# Patient Record
Sex: Female | Born: 1980
Health system: Southern US, Community
[De-identification: ages and names within clinical notes are randomized; demographics above are authoritative.]

## PROBLEM LIST (undated history)

## (undated) ENCOUNTER — Inpatient Hospital Stay (HOSPITAL_COMMUNITY): Payer: Self-pay

## (undated) DIAGNOSIS — Z8619 Personal history of other infectious and parasitic diseases: Secondary | ICD-10-CM

## (undated) DIAGNOSIS — B009 Herpesviral infection, unspecified: Secondary | ICD-10-CM

## (undated) DIAGNOSIS — D696 Thrombocytopenia, unspecified: Secondary | ICD-10-CM

## (undated) DIAGNOSIS — D649 Anemia, unspecified: Secondary | ICD-10-CM

## (undated) DIAGNOSIS — Z862 Personal history of diseases of the blood and blood-forming organs and certain disorders involving the immune mechanism: Secondary | ICD-10-CM

## (undated) DIAGNOSIS — I863 Vulval varices: Secondary | ICD-10-CM

## (undated) HISTORY — DX: Anemia, unspecified: D64.9

## (undated) HISTORY — DX: Thrombocytopenia, unspecified: D69.6

## (undated) HISTORY — DX: Personal history of diseases of the blood and blood-forming organs and certain disorders involving the immune mechanism: Z86.2

## (undated) HISTORY — DX: Herpesviral infection, unspecified: B00.9

## (undated) HISTORY — DX: Personal history of other infectious and parasitic diseases: Z86.19

## (undated) HISTORY — DX: Vulval varices: I86.3

---

## 2008-07-23 ENCOUNTER — Inpatient Hospital Stay (HOSPITAL_COMMUNITY): Admission: AD | Admit: 2008-07-23 | Discharge: 2008-07-23 | Payer: Self-pay | Admitting: Obstetrics & Gynecology

## 2008-07-23 ENCOUNTER — Inpatient Hospital Stay (HOSPITAL_COMMUNITY): Admission: AD | Admit: 2008-07-23 | Discharge: 2008-07-25 | Payer: Self-pay | Admitting: Obstetrics and Gynecology

## 2011-05-20 LAB — RH IMMUNE GLOB WKUP(>/=20WKS)(NOT WOMEN'S HOSP): Fetal Screen: NEGATIVE

## 2011-05-20 LAB — CBC
HCT: 28.9 % — ABNORMAL LOW (ref 36.0–46.0)
HCT: 36.2 % (ref 36.0–46.0)
Hemoglobin: 10.1 g/dL — ABNORMAL LOW (ref 12.0–15.0)
Hemoglobin: 12.7 g/dL (ref 12.0–15.0)
MCHC: 35.1 g/dL (ref 30.0–36.0)
MCHC: 35.3 g/dL (ref 30.0–36.0)
MCV: 96.4 fL (ref 78.0–100.0)
MCV: 96.7 fL (ref 78.0–100.0)
Platelets: 86 10*3/uL — ABNORMAL LOW (ref 150–400)
Platelets: 88 10*3/uL — ABNORMAL LOW (ref 150–400)
Platelets: 94 10*3/uL — ABNORMAL LOW (ref 150–400)
RBC: 3.02 MIL/uL — ABNORMAL LOW (ref 3.87–5.11)
RDW: 13.1 % (ref 11.5–15.5)
RDW: 13.2 % (ref 11.5–15.5)
RDW: 13.7 % (ref 11.5–15.5)
WBC: 7.8 10*3/uL (ref 4.0–10.5)
WBC: 8.9 10*3/uL (ref 4.0–10.5)

## 2011-05-20 LAB — CCBB MATERNAL DONOR DRAW

## 2012-01-26 LAB — OB RESULTS CONSOLE ABO/RH: RH Type: NEGATIVE

## 2012-01-26 LAB — OB RESULTS CONSOLE HEPATITIS B SURFACE ANTIGEN: Hepatitis B Surface Ag: NEGATIVE

## 2012-01-26 LAB — OB RESULTS CONSOLE HIV ANTIBODY (ROUTINE TESTING): HIV: NONREACTIVE

## 2012-01-26 LAB — OB RESULTS CONSOLE RPR: RPR: NONREACTIVE

## 2012-08-14 LAB — OB RESULTS CONSOLE GBS: GBS: NEGATIVE

## 2012-08-15 NOTE — L&D Delivery Note (Signed)
Delivery Note At 5:57 PM a healthy female was delivered via 2nd VBAC, Spontaneous (Presentation: Right Occiput Anterior), 1 loose [place].  APGAR: 9, 10; weight .   Placenta status: Intact, Spontaneous.  Cord: 3 vessels with the following complications: None.  Cord pH: n/a  Anesthesia: Epidural  Episiotomy: None Lacerations: Right vulvar tears x 2  Suture Repair: vicryl rapide 3-0 and 4-0 Est. Blood Loss (mL): 300  Mom to postpartum.  Baby to nursery-stable.  [name],[name] 09/21/2012, 6:33 PM

## 2012-09-10 ENCOUNTER — Telehealth (HOSPITAL_COMMUNITY): Payer: Self-pay | Admitting: *Deleted

## 2012-09-10 ENCOUNTER — Encounter (HOSPITAL_COMMUNITY): Payer: Self-pay | Admitting: *Deleted

## 2012-09-10 NOTE — Telephone Encounter (Signed)
Preadmission screen  

## 2012-09-11 ENCOUNTER — Other Ambulatory Visit: Payer: Self-pay | Admitting: Obstetrics & Gynecology

## 2012-09-15 ENCOUNTER — Inpatient Hospital Stay (HOSPITAL_COMMUNITY)
Admission: AD | Admit: 2012-09-15 | Discharge: 2012-09-16 | DRG: 886 | Disposition: A | Discharge status: Home / Self Care | Payer: BC Managed Care – PPO | Source: Ambulatory Visit | Attending: Obstetrics and Gynecology | Admitting: Obstetrics and Gynecology

## 2012-09-15 ENCOUNTER — Inpatient Hospital Stay (HOSPITAL_COMMUNITY): Payer: BC Managed Care – PPO

## 2012-09-15 ENCOUNTER — Encounter (HOSPITAL_COMMUNITY): Payer: Self-pay

## 2012-09-15 DIAGNOSIS — O34219 Maternal care for unspecified type scar from previous cesarean delivery: Secondary | ICD-10-CM | POA: Diagnosis present

## 2012-09-15 DIAGNOSIS — O368190 Decreased fetal movements, unspecified trimester, not applicable or unspecified: Principal | ICD-10-CM | POA: Diagnosis present

## 2012-09-15 DIAGNOSIS — O99119 Other diseases of the blood and blood-forming organs and certain disorders involving the immune mechanism complicating pregnancy, unspecified trimester: Secondary | ICD-10-CM | POA: Diagnosis present

## 2012-09-15 DIAGNOSIS — IMO0002 Reserved for concepts with insufficient information to code with codable children: Secondary | ICD-10-CM

## 2012-09-15 DIAGNOSIS — D696 Thrombocytopenia, unspecified: Secondary | ICD-10-CM | POA: Diagnosis present

## 2012-09-15 DIAGNOSIS — D689 Coagulation defect, unspecified: Secondary | ICD-10-CM | POA: Diagnosis present

## 2012-09-15 LAB — CBC
HCT: 29.2 % — ABNORMAL LOW (ref 36.0–46.0)
Hemoglobin: 9.4 g/dL — ABNORMAL LOW (ref 12.0–15.0)
MCH: 28.1 pg (ref 26.0–34.0)
MCHC: 32.2 g/dL (ref 30.0–36.0)
MCV: 87.2 fL (ref 78.0–100.0)
Platelets: 132 10*3/uL — ABNORMAL LOW (ref 150–400)
RBC: 3.35 MIL/uL — ABNORMAL LOW (ref 3.87–5.11)
RDW: 15.9 % — ABNORMAL HIGH (ref 11.5–15.5)
WBC: 9 10*3/uL (ref 4.0–10.5)

## 2012-09-15 MED ORDER — ACETAMINOPHEN 325 MG PO TABS: 650.0000 mg | ORAL_TABLET | ORAL | Status: DC | PRN

## 2012-09-15 MED ORDER — CALCIUM CARBONATE ANTACID 500 MG PO CHEW
2.0000 | CHEWABLE_TABLET | ORAL | Status: DC | PRN
  Filled 2012-09-15: qty 2

## 2012-09-15 NOTE — MAU Provider Note (Signed)
  History     CSN: [id number]  Arrival date and time: 09/15/12 4098 Nurse report received - verbal order for NST and BPP given @ 2007 (provider en route for delivery) Provider here to evaluate patient @ 2115   Chief Complaint  Patient presents with  . Decreased Fetal Movement   HPI  Decreased fetal movement since this am - last movement when she woke up this am Mild occasional ctx No bleeding or LOF  Past Medical History  Diagnosis Date  . Personal history of diseases of blood and blood-forming organs     thrombophilia  . Hx of varicella   . Vulval varices   . Herpes simplex without mention of complication   . Thrombocytopenia, unspecified   . Anemia    Past Surgical History  Procedure Date  . Cesarean section    Family History  Problem Relation Age of Onset  . Diabetes Father   . Migraines Sister   . Cancer Maternal Grandmother     pancreatic   History  Substance Use Topics  . Smoking status: Never Smoker   . Smokeless tobacco: Not on file  . Alcohol Use: No   Allergies: No Known Allergies  Prescriptions prior to admission  Medication Sig Dispense Refill  . acetaminophen (TYLENOL) 500 MG tablet Take 1,000 mg by mouth every 6 (six) hours as needed.      . Prenatal Vit-Fe Fumarate-FA (PRENATAL MULTIVITAMIN) TABS Take 1 tablet by mouth daily.      . valACYclovir (VALTREX) 500 MG tablet Take 500 mg by mouth daily.       ROS Physical Exam   Blood pressure 105/59, pulse 62, temperature 98.7 F (37.1 C), temperature source Oral, resp. rate 18, height 5' 2.25" (1.581 m), weight 67.405 kg (148 lb 9.6 oz), last menstrual period 12/06/2011.  Physical Exam  EFM initial tracing 95-115 baseline with irregular variability - maternal pulse documented with pulse oximetry 70-80 range. Off EFM to go to ultrasound. Upon return to MAU EFM reapplied with marked variability 110-130 with gradual increase of FHR to 170-180 range with decreased variability. EFM 130-150 increased  variability - no decelerations.  MAU Course  Procedures  NST - equivocal due to unstable baseline BPP - 8/8 with normal AFI  Assessment and Plan   40 4/7 with decreased FM Abnormal FHR baseline - normal BPP and AFI  1) admit 2) prolonged EFM 3) MD re-evaluate in AM   Consult with Dr Billy Coast with plan for extended [facility] - no indication for delivery based on EFM - desired VBAC.  Will monitor next 6-8 hours then make further decision after review of extended EFM tracing.  Discussed with patient and spouse - recommended extended monitoring to ensure fetal well-being prior to discharge to home. No indication for induction at this time. Will re-evaluate status for induction / delivery with any concern in FHR tracing. Agrees with plan of care  - prolonged EFM til AM. Marlinda Mike 09/15/2012, 9:23 PM

## 2012-09-15 NOTE — Plan of Care (Signed)
Problem: Consults Goal: Birthing Suites Patient Information Press F2 to bring up selections list Outcome: Completed/Met Date Met:  09/15/12  Pt > [redacted] weeks EGA  Comments:  40.4 wks

## 2012-09-15 NOTE — H&P (Signed)
  OB ADMISSION/ HISTORY & PHYSICAL:  Admission Date: 09/15/2012  7:42 PM  Admit Diagnosis: 40 4/7 weeks with decreased FM / abnormal FHR baseline  Mary Hood is a 32 y.o. female presenting for decreased FM - no FM since waking this am.  Prenatal History: [id number]   EDC : 09/11/2012, by Last Menstrual Period  Prenatal care at [facility]  Primary Ob Provider:Dr Lavoie Prenatal course complicated by genetic thrombophilia/ HSV (on suppression) / previous CS and successful VBAC with plans to VBAC again this pregnancy / anemia / mild ITP (stable PLT)  Prenatal Labs: ABO, Rh: A (06/13 0000) negative Antibody: Negative (06/13 0000) Rubella: Immune (06/13 0000)  RPR: Nonreactive (06/13 0000)  HBsAg: Negative (06/13 0000)  HIV: Non-reactive (06/13 0000)  GBS: Negative (12/31 0000)  1 hr Glucola : NL  Medical / Surgical History :  Past medical history:  Past Medical History  Diagnosis Date  . Personal history of diseases of blood and blood-forming organs     thrombophilia  . Hx of varicella   . Vulval varices   . Herpes simplex without mention of complication   . Thrombocytopenia, unspecified   . Anemia     Past surgical history:  Past Surgical History  Procedure Date  . Cesarean section    Family History:  Family History  Problem Relation Age of Onset  . Diabetes Father   . Migraines Sister   . Cancer Maternal Grandmother     pancreatic    Social History:  reports that she has never smoked. She does not have any smokeless tobacco history on file. She reports that she does not drink alcohol or use illicit drugs.  Allergies: Review of patient's allergies indicates no known allergies.   Current Medications at time of admission:  No current facility-administered medications for this encounter.   Review of Systems: No FM all day No ctx or labor signs  Physical Exam:  VS: Blood pressure 105/59, pulse 62, temperature 98.7 F (37.1 C), temperature  source Oral, resp. rate 18, height 5' 2.25" (1.581 m), weight 67.405 kg (148 lb 9.6 oz), last menstrual period 12/06/2011.  General: alert and oriented, appears NAD or discomfort Heart: RRR Lungs: Clear lung fields Abdomen: Gravid, soft and non-tender, non-distended / uterus: nontender Extremities: no edema  Genitalia / VE:  deferred at time of admit / previous exam 3cm dilated / 70 % / vtx  FHR: baseline rate unstable - initially 95-115 x 10 minutes then 170-180 with decreased variability for 15 minutes then 130-140 range with increased variability  TOCO: rare ctx - no decels with ctx  Assessment: Postdates with decreased FM Unstable FHR baseline with normal BPP and AFI Previous CS and VBAC with strong desire to repeat VBAC - not in labor  Plan:  Admit - extended EFM to evaluate fetal status CBC Re-evaluate in AM or sooner if any fetal concerns with monitoring  Dr Billy Coast notified of admission / plan of care - agrees with prolonged EFM No indication for IOL at this time since planning VBAC   Marlinda Mike CNM, MSN 09/15/2012, 9:43 PM

## 2012-09-15 NOTE — MAU Note (Signed)
Last felt baby move this morning. Denies contractions or leaking of fluid.

## 2012-09-16 ENCOUNTER — Encounter (HOSPITAL_COMMUNITY): Payer: Self-pay | Admitting: Certified Nurse Midwife

## 2012-09-16 MED ORDER — IRON POLYSACCH CMPLX-B12-FA 150-0.025-1 MG PO CAPS: 150.0000 mg | ORAL_CAPSULE | Freq: Two times a day (BID) | ORAL | Status: DC

## 2012-09-16 NOTE — Discharge Summary (Signed)
Patient ID: Mary Hood MRN: [id number] DOB/AGE: 10/06/1980 32 y.o.  Admit date: 09/15/2012 Admission Diagnoses: decreased FM  / abnormal FHR baseline    Discharge date:  09/16/2012 Discharge Diagnoses:   40 5/7 weeks / category 1 tracing - NST reactive / improved fetal movements  Prenatal history: G3P2002   EDC : 09/11/2012, by Last Menstrual Period  Prenatal care at [facility]  Primary provider : Seymour Bars Prenatal course complicated by previous CS with successful VBAC (plans VBAC) / IDA / gestational thrombocytopenia / genetic thrombophilia (?type)  Prenatal Labs: ABO, Rh: A (06/13 0000) negative Antibody: POS (02/01 2200) Rubella: Immune (06/13 0000)  RPR: Nonreactive (06/13 0000)  HBsAg: Negative (06/13 0000)  HIV: Non-reactive (06/13 0000)  GBS: Negative (12/31 0000)  1 hr Glucola : NL  Medical / Surgical History :  Past medical history:  Past Medical History  Diagnosis Date  . Personal history of diseases of blood and blood-forming organs     thrombophilia  . Hx of varicella   . Vulval varices   . Herpes simplex without mention of complication   . Thrombocytopenia, unspecified   . Anemia     Past surgical history:  Past Surgical History  Procedure Date  . Cesarean section     Family History:  Family History  Problem Relation Age of Onset  . Diabetes Father   . Migraines Sister   . Cancer Maternal Grandmother     pancreatic    Social History:  reports that she has never smoked. She does not have any smokeless tobacco history on file. She reports that she does not drink alcohol or use illicit drugs.  Allergies: Review of patient's allergies indicates no known allergies.   Current Medications at time of admission:  Prior to Admission medications   Medication Sig Start Date End Date Taking? Authorizing Provider  acetaminophen (TYLENOL) 500 MG tablet Take 1,000 mg by mouth every 6 (six) hours as needed.   Yes Historical Provider, MD   Prenatal Vit-Fe Fumarate-FA (PRENATAL MULTIVITAMIN) TABS Take 1 tablet by mouth daily.   Yes Historical Provider, MD  valACYclovir (VALTREX) 500 MG tablet Take 500 mg by mouth daily.   Yes Historical Provider, MD    Hospital Course: Evaluated inMAU for decreased FM - NST equivocal with unstable FHR baseline (initially 95-115 - documented maternal pulse 70-80  / episode of tachycardia 170-180 / baseline eventually settled into 130-140 range with normal variability and reactive tracing / no further changes in FHR baseline in 8 hours /BPP 8-8 with normal AFI.  CBC checked due to patient hx IDA and thrombocytopenia - HGB 9.4 with PLT 132.  Physical Exam:   VSS: Temp:  [98.1 F (36.7 C)-98.9 F (37.2 C)] 98.1 F (36.7 C) (02/02 0755) Pulse Rate:  [60-62] 60  (02/02 0755) Resp:  [18-20] 20  (02/02 0755) BP: (98-105)/(59-62) 98/60 mmHg (02/02 0755) Weight:  [67.405 kg (148 lb 9.6 oz)] 67.405 kg (148 lb 9.6 oz) (02/01 1953)  LABS:  hemogloblin 9.4 and platelet 132 General: pleasant / NAD Heart:RRR Lungs:clear and unlabored Abdomen: gravid and non-tender Extremities: no edema  Fetal assessment - category 1 tracing with stable baseline 130-140 for past 8 hours                                  Reactive NST  Discharge Instructions:  Discharged Condition: stable Activity: unrestricted Diet: routine Medications: see below  Medication List     As of 09/16/2012  9:11 AM    TAKE these medications         acetaminophen 500 MG tablet   Commonly known as: TYLENOL   Take 1,000 mg by mouth every 6 (six) hours as needed.      prenatal multivitamin Tabs   Take 1 tablet by mouth daily.      valACYclovir 500 MG tablet   Commonly known as: VALTREX   Take 500 mg by mouth daily.      Added iron daily to continue thru postpartum  Discharge Instructions:   Discharge to: Home Follow up :      Follow-up Information    Follow up with LAVOIE,[name], MD. In 2 days.   Contact  information:   [address] [place] [address] [phone]         Signed: Marlinda Mike, CNM, MSN 09/16/2012, 9:11 AM

## 2012-09-16 NOTE — Progress Notes (Signed)
S:  Feeling FM throughout the night      No labor signs  O:  VS: Blood pressure 98/60, pulse 60, temperature 98.1 F (36.7 C), temperature source Oral, resp. rate 20, height 5' 2.25" (1.581 m), weight 67.405 kg (148 lb 9.6 oz), last menstrual period 12/06/2011.        FHR : baseline 130-140 range / variability moderate / accels + / decels none        Toco: contractions rare and mild        Cervix : deferred     A: Decreased FM yesterday with equivocal EFM tracing     Reactive NST this am / active FM today     FHR category 1  P: discharge to home this am     Await onset of labor     Apt at [facility] Tuesday as scheduled - daily FKC   Marlinda Mike CNM, MSN 09/16/2012, 9:06 AM

## 2012-09-18 LAB — TYPE AND SCREEN
ABO/RH(D): A NEG
Antibody Screen: POSITIVE
DAT, IgG: NEGATIVE
Unit division: 0

## 2012-09-21 ENCOUNTER — Encounter (HOSPITAL_COMMUNITY): Payer: Self-pay | Admitting: Anesthesiology

## 2012-09-21 ENCOUNTER — Inpatient Hospital Stay (HOSPITAL_COMMUNITY): Payer: BC Managed Care – PPO | Admitting: Anesthesiology

## 2012-09-21 ENCOUNTER — Inpatient Hospital Stay (HOSPITAL_COMMUNITY)
Admission: RE | Admit: 2012-09-21 | Discharge: 2012-09-22 | DRG: 373 | Disposition: A | Discharge status: Home / Self Care | Payer: BC Managed Care – PPO | Source: Ambulatory Visit | Attending: Obstetrics & Gynecology | Admitting: Obstetrics & Gynecology

## 2012-09-21 ENCOUNTER — Encounter (HOSPITAL_COMMUNITY): Payer: Self-pay

## 2012-09-21 VITALS — BP 109/71 | HR 67 | Temp 97.9°F | Resp 18 | Ht 62.25 in | Wt 148.0 lb

## 2012-09-21 DIAGNOSIS — O480 Post-term pregnancy: Principal | ICD-10-CM | POA: Diagnosis present

## 2012-09-21 DIAGNOSIS — O328XX0 Maternal care for other malpresentation of fetus, not applicable or unspecified: Secondary | ICD-10-CM | POA: Diagnosis present

## 2012-09-21 DIAGNOSIS — D649 Anemia, unspecified: Secondary | ICD-10-CM | POA: Diagnosis not present

## 2012-09-21 DIAGNOSIS — O368190 Decreased fetal movements, unspecified trimester, not applicable or unspecified: Secondary | ICD-10-CM

## 2012-09-21 DIAGNOSIS — O9903 Anemia complicating the puerperium: Secondary | ICD-10-CM | POA: Diagnosis not present

## 2012-09-21 DIAGNOSIS — O9902 Anemia complicating childbirth: Secondary | ICD-10-CM | POA: Diagnosis present

## 2012-09-21 LAB — CBC
HCT: 31.4 % — ABNORMAL LOW (ref 36.0–46.0)
HCT: 32.7 % — ABNORMAL LOW (ref 36.0–46.0)
Hemoglobin: 10.3 g/dL — ABNORMAL LOW (ref 12.0–15.0)
Hemoglobin: 10.6 g/dL — ABNORMAL LOW (ref 12.0–15.0)
MCH: 28.4 pg (ref 26.0–34.0)
MCH: 28 pg (ref 26.0–34.0)
MCHC: 32.8 g/dL (ref 30.0–36.0)
MCHC: 32.4 g/dL (ref 30.0–36.0)
MCV: 86.5 fL (ref 78.0–100.0)
Platelets: 135 10*3/uL — ABNORMAL LOW (ref 150–400)
RBC: 3.63 MIL/uL — ABNORMAL LOW (ref 3.87–5.11)
RDW: 16.1 % — ABNORMAL HIGH (ref 11.5–15.5)
WBC: 7.3 10*3/uL (ref 4.0–10.5)

## 2012-09-21 LAB — PREPARE RBC (CROSSMATCH)

## 2012-09-21 MED ORDER — EPHEDRINE 5 MG/ML INJ
10.0000 mg | INTRAVENOUS | Status: DC | PRN
  Filled 2012-09-21: qty 4

## 2012-09-21 MED ORDER — IBUPROFEN 600 MG PO TABS: 600.0000 mg | ORAL_TABLET | Freq: Four times a day (QID) | ORAL | Status: DC | PRN

## 2012-09-21 MED ORDER — ZOLPIDEM TARTRATE 5 MG PO TABS: 5.0000 mg | ORAL_TABLET | Freq: Every evening | ORAL | Status: DC | PRN

## 2012-09-21 MED ORDER — OXYCODONE-ACETAMINOPHEN 5-325 MG PO TABS: 1.0000 | ORAL_TABLET | ORAL | Status: DC | PRN

## 2012-09-21 MED ORDER — OXYTOCIN 40 UNITS IN LACTATED RINGERS INFUSION - SIMPLE MED
1.0000 m[IU]/min | INTRAVENOUS | Status: DC
  Administered 2012-09-21: 2 m[IU]/min via INTRAVENOUS
  Administered 2012-09-21: 6 m[IU]/min via INTRAVENOUS
  Administered 2012-09-21: 8 m[IU]/min via INTRAVENOUS
  Filled 2012-09-21: qty 1000

## 2012-09-21 MED ORDER — LANOLIN HYDROUS EX OINT: TOPICAL_OINTMENT | CUTANEOUS | Status: DC | PRN

## 2012-09-21 MED ORDER — DIBUCAINE 1 % RE OINT: 1.0000 "application " | TOPICAL_OINTMENT | RECTAL | Status: DC | PRN

## 2012-09-21 MED ORDER — ONDANSETRON HCL 4 MG/2ML IJ SOLN: 4.0000 mg | INTRAMUSCULAR | Status: DC | PRN

## 2012-09-21 MED ORDER — LACTATED RINGERS IV SOLN
500.0000 mL | INTRAVENOUS | Status: DC | PRN
  Administered 2012-09-21: 1000 mL via INTRAVENOUS

## 2012-09-21 MED ORDER — OXYTOCIN BOLUS FROM INFUSION: 500.0000 mL | INTRAVENOUS | Status: DC

## 2012-09-21 MED ORDER — FLEET ENEMA 7-19 GM/118ML RE ENEM: 1.0000 | ENEMA | RECTAL | Status: DC | PRN

## 2012-09-21 MED ORDER — ONDANSETRON HCL 4 MG/2ML IJ SOLN: 4.0000 mg | Freq: Four times a day (QID) | INTRAMUSCULAR | Status: DC | PRN

## 2012-09-21 MED ORDER — FENTANYL 2.5 MCG/ML BUPIVACAINE 1/10 % EPIDURAL INFUSION (WH - ANES)
14.0000 mL/h | INTRAMUSCULAR | Status: DC
  Filled 2012-09-21: qty 125

## 2012-09-21 MED ORDER — SIMETHICONE 80 MG PO CHEW: 80.0000 mg | CHEWABLE_TABLET | ORAL | Status: DC | PRN

## 2012-09-21 MED ORDER — TETANUS-DIPHTH-ACELL PERTUSSIS 5-2.5-18.5 LF-MCG/0.5 IM SUSP: 0.5000 mL | Freq: Once | INTRAMUSCULAR | Status: DC

## 2012-09-21 MED ORDER — LIDOCAINE HCL (PF) 1 % IJ SOLN
INTRAMUSCULAR | Status: DC | PRN
  Administered 2012-09-21: 4 mL
  Administered 2012-09-21: 30 mL
  Administered 2012-09-21: 4 mL

## 2012-09-21 MED ORDER — WITCH HAZEL-GLYCERIN EX PADS: 1.0000 "application " | MEDICATED_PAD | CUTANEOUS | Status: DC | PRN

## 2012-09-21 MED ORDER — FENTANYL 2.5 MCG/ML BUPIVACAINE 1/10 % EPIDURAL INFUSION (WH - ANES)
INTRAMUSCULAR | Status: DC | PRN
  Administered 2012-09-21: 13 mL/h via EPIDURAL

## 2012-09-21 MED ORDER — OXYTOCIN 40 UNITS IN LACTATED RINGERS INFUSION - SIMPLE MED: 62.5000 mL/h | INTRAVENOUS | Status: DC

## 2012-09-21 MED ORDER — LIDOCAINE HCL (PF) 1 % IJ SOLN
30.0000 mL | INTRAMUSCULAR | Status: DC | PRN
  Filled 2012-09-21: qty 30

## 2012-09-21 MED ORDER — ONDANSETRON HCL 4 MG PO TABS: 4.0000 mg | ORAL_TABLET | ORAL | Status: DC | PRN

## 2012-09-21 MED ORDER — DIPHENHYDRAMINE HCL 25 MG PO CAPS: 25.0000 mg | ORAL_CAPSULE | Freq: Four times a day (QID) | ORAL | Status: DC | PRN

## 2012-09-21 MED ORDER — ACETAMINOPHEN 325 MG PO TABS: 650.0000 mg | ORAL_TABLET | ORAL | Status: DC | PRN

## 2012-09-21 MED ORDER — BENZOCAINE-MENTHOL 20-0.5 % EX AERO
1.0000 "application " | INHALATION_SPRAY | CUTANEOUS | Status: DC | PRN
  Administered 2012-09-22: 1 via TOPICAL
  Filled 2012-09-21 (×2): qty 56

## 2012-09-21 MED ORDER — PHENYLEPHRINE 40 MCG/ML (10ML) SYRINGE FOR IV PUSH (FOR BLOOD PRESSURE SUPPORT)
80.0000 ug | PREFILLED_SYRINGE | INTRAVENOUS | Status: DC | PRN
  Filled 2012-09-21: qty 5

## 2012-09-21 MED ORDER — CITRIC ACID-SODIUM CITRATE 334-500 MG/5ML PO SOLN: 30.0000 mL | ORAL | Status: DC | PRN

## 2012-09-21 MED ORDER — PRENATAL MULTIVITAMIN CH
1.0000 | ORAL_TABLET | Freq: Every day | ORAL | Status: DC
  Administered 2012-09-22: 1 via ORAL
  Filled 2012-09-21: qty 1

## 2012-09-21 MED ORDER — LACTATED RINGERS IV SOLN
INTRAVENOUS | Status: DC
  Administered 2012-09-21 (×2): via INTRAVENOUS

## 2012-09-21 MED ORDER — TERBUTALINE SULFATE 1 MG/ML IJ SOLN: 0.2500 mg | Freq: Once | INTRAMUSCULAR | Status: DC | PRN

## 2012-09-21 MED ORDER — IBUPROFEN 600 MG PO TABS
600.0000 mg | ORAL_TABLET | Freq: Four times a day (QID) | ORAL | Status: DC
Start: 2012-09-21 — End: 2012-09-22
  Administered 2012-09-21 – 2012-09-22 (×3): 600 mg via ORAL
  Filled 2012-09-21 (×3): qty 1

## 2012-09-21 MED ORDER — PHENYLEPHRINE 40 MCG/ML (10ML) SYRINGE FOR IV PUSH (FOR BLOOD PRESSURE SUPPORT): 80.0000 ug | PREFILLED_SYRINGE | INTRAVENOUS | Status: DC | PRN

## 2012-09-21 MED ORDER — SENNOSIDES-DOCUSATE SODIUM 8.6-50 MG PO TABS
2.0000 | ORAL_TABLET | Freq: Every day | ORAL | Status: DC
Start: 2012-09-21 — End: 2012-09-22
  Administered 2012-09-21: 2 via ORAL

## 2012-09-21 MED ORDER — LACTATED RINGERS IV SOLN: 500.0000 mL | Freq: Once | INTRAVENOUS | Status: DC

## 2012-09-21 MED ORDER — EPHEDRINE 5 MG/ML INJ: 10.0000 mg | INTRAVENOUS | Status: DC | PRN

## 2012-09-21 MED ORDER — DIPHENHYDRAMINE HCL 50 MG/ML IJ SOLN: 12.5000 mg | INTRAMUSCULAR | Status: DC | PRN

## 2012-09-21 NOTE — Anesthesia Preprocedure Evaluation (Signed)
Anesthesia Evaluation  Patient identified by MRN, date of birth, ID band Patient awake    Reviewed: Allergy & Precautions, H&P , Patient's Chart, lab work & pertinent test results  Airway Mallampati: III TM Distance: >3 FB Neck ROM: full    Dental No notable dental hx. (+) Teeth Intact   Pulmonary neg pulmonary ROS,  breath sounds clear to auscultation  Pulmonary exam normal       Cardiovascular negative cardio ROS  Rhythm:regular Rate:Normal     Neuro/Psych negative neurological ROS  negative psych ROS   GI/Hepatic negative GI ROS, Neg liver ROS,   Endo/Other  negative endocrine ROS  Renal/GU negative Renal ROS  negative genitourinary   Musculoskeletal   Abdominal Normal abdominal exam  (+)   Peds  Hematology negative hematology ROS (+) anemia , Thrombocytopenica   Anesthesia Other Findings   Reproductive/Obstetrics (+) Pregnancy                           Anesthesia Physical Anesthesia Plan  ASA: II  Anesthesia Plan: Epidural   Post-op Pain Management:    Induction:   Airway Management Planned:   Additional Equipment:   Intra-op Plan:   Post-operative Plan:   Informed Consent: I have reviewed the patients History and Physical, chart, labs and discussed the procedure including the risks, benefits and alternatives for the proposed anesthesia with the patient or authorized representative who has indicated his/her understanding and acceptance.     Plan Discussed with: Anesthesiologist  Anesthesia Plan Comments:         Anesthesia Quick Evaluation

## 2012-09-21 NOTE — Progress Notes (Signed)
Called Anesthesia to notify of platelets being 116, MD ok to remove epidural catheter.

## 2012-09-21 NOTE — H&P (Signed)
Subjective:  Mary Hood is a 33 y.o. G3 P2 female with EDC 09/11/2012 at 41 and 3/[redacted] weeks gestation who is being admitted for induction of labor.  Her current obstetrical history is significant for postdates.  Patient reports no complaints.   Fetal Movement: normal.     Objective:   Vital signs in last 24 hours: Temp:  [98.2 F (36.8 C)] 98.2 F (36.8 C) (02/07 0708) Pulse Rate:  [46-69] 62  (02/07 1249) Resp:  [20] 20  (02/07 0708) BP: (94-112)/(48-77) 109/71 mmHg (02/07 1247) SpO2:  [78 %-100 %] 100 % (02/07 1249) Weight:  [67.132 kg (148 lb)] 67.132 kg (148 lb) (02/07 0921)   General:   alert  Skin:   normal  HEENT:  PERRLA  Lungs:   clear to auscultation bilaterally  Heart:   regular rate and rhythm, S1, S2 normal, no murmur, click, rub or gallop  Breasts:   normal without suspicious masses, skin or nipple changes or axillary nodes and self-exam is taught and encouraged  Abdomen:  normal findings: Gravid  Pelvis:  Cervix: 2/70%/VTx/-2 compound presentation with fingers felt.  AROM very little AF.  FHT:  140's BPM  Accelerations present, no deceleration.  Uterine Size: size equals dates  Presentations: cephalic  Cervix:    Dilation: 2cm   Effacement: 70   Station:  -2   Consistency: soft   Position: middle   Lab Review  A, Rh -  Rhophylac received.  AFP:NML, Ultrascreen neg.  [place] anato wnl.  One hour GTT: Normal   GBS neg.  Assessment/Plan:  41 and 3/[redacted] weeks gestation. Not in labor. Induction with AROM/Pito.  Compound presentation cephalic/hand, will observe.   F-well-being reassuring.  PAI-1, BB ASA d/ced.   Obstetrical history significant for post dates.     Risks, benefits, alternatives and possible complications have been discussed in detail with the patient.  Pre-admission, admission, and post admission procedures and expectations were discussed in detail.  All questions answered, all appropriate consents will be signed at the Hospital. Admission is planned for  today.  Admit for Induction.

## 2012-09-21 NOTE — Anesthesia Procedure Notes (Signed)
Epidural Patient location during procedure: OB Start time: 09/21/2012 12:46 PM  Staffing Anesthesiologist: Ege Muckey A. Performed by: anesthesiologist   Preanesthetic Checklist Completed: patient identified, site marked, surgical consent, pre-op evaluation, timeout performed, IV checked, risks and benefits discussed and monitors and equipment checked  Epidural Patient position: sitting Prep: site prepped and draped and DuraPrep Patient monitoring: continuous pulse ox and blood pressure Approach: midline Injection technique: LOR air  Needle:  Needle type: Tuohy  Needle gauge: 17 G Needle length: 9 cm and 9 Needle insertion depth: 5 cm cm Catheter type: closed end flexible Catheter size: 19 Gauge Catheter at skin depth: 10 cm Test dose: negative and Other  Assessment Events: blood not aspirated, injection not painful, no injection resistance, negative IV test and no paresthesia  Additional Notes Patient identified. Risks and benefits discussed including failed block, incomplete  Pain control, post dural puncture headache, nerve damage, paralysis, blood pressure Changes, nausea, vomiting, reactions to medications-both toxic and allergic and post Partum back pain. All questions were answered. Patient expressed understanding and wished to proceed. Sterile technique was used throughout procedure. Epidural site was Dressed with sterile barrier dressing. No paresthesias, signs of intravascular injection Or signs of intrathecal spread were encountered.  Patient was more comfortable after the epidural was dosed. Please see RN's note for documentation of vital signs and FHR which are stable.

## 2012-09-21 NOTE — Progress Notes (Signed)
efm d/cd per anesthesia  

## 2012-09-21 NOTE — Progress Notes (Signed)
Subjective: Doing well, pain controled, UCs q3 min,  Anesthesia epidural   Objective: BP 105/58  Pulse 45  Temp 98.2 F (36.8 C) (Oral)  Resp 20  Ht 5' 2.25" (1.581 m)  Wt 67.132 kg (148 lb)  BMI 26.85 kg/m2  SpO2 97%  LMP 12/06/2011   FHT:  FHR: 140's bpm, variability: moderate,  accelerations:  Present,  decelerations:  Present Occasional mild variable decelerations UC:   regular, every 3 minutes VE:   8 cm/100%/Vtx ROA/+2   Assessment / Plan: Induction of labor due to postterm,  progressing well on pitocin.  Fetal Wellbeing:  Category I Pain Control:  Epidural  Anticipated MOD:  NSVD/Preparing for delivery.  Mary Hood,Mary Hood 09/21/2012, 4:00 PM

## 2012-09-21 NOTE — Progress Notes (Signed)
Subjective:  2nd TOLAC Doing well, pain controled with Epidural, UCs q3 min ++  Anesthesia Epidural   Objective: BP 109/71  Pulse 62  Temp 98.2 F (36.8 C) (Oral)  Resp 20  Ht 5' 2.25" (1.581 m)  Wt 67.132 kg (148 lb)  BMI 26.85 kg/m2  SpO2 100%  LMP 12/06/2011   FHT:  FHR: 140's bpm, variability: moderate,  accelerations:  Present,  decelerations:  Absent UC:   regular, every 3 minutes VE:   Dilation: 2 Effacement (%): 70 Station: -1 Exam by:: Paul Dykes RNC   Assessment / Plan: Induction of labor due to postterm,  progressing well on pitocin  Fetal Wellbeing:  Category I Pain Control:  Epidural  Anticipated MOD:  NSVD  Glorya Bartley,[name] 09/21/2012, 1:19 PM

## 2012-09-22 DIAGNOSIS — O9902 Anemia complicating childbirth: Secondary | ICD-10-CM | POA: Diagnosis present

## 2012-09-22 LAB — CBC
MCH: 27.8 pg (ref 26.0–34.0)
MCV: 86.9 fL (ref 78.0–100.0)
Platelets: 140 10*3/uL — ABNORMAL LOW (ref 150–400)
RDW: 16.1 % — ABNORMAL HIGH (ref 11.5–15.5)

## 2012-09-22 MED ORDER — RHO D IMMUNE GLOBULIN 1500 UNIT/2ML IJ SOLN
300.0000 ug | Freq: Once | INTRAMUSCULAR | Status: AC
  Administered 2012-09-22: 300 ug via INTRAMUSCULAR
  Filled 2012-09-22: qty 2

## 2012-09-22 MED ORDER — OXYCODONE-ACETAMINOPHEN 5-325 MG PO TABS: 1.0000 | ORAL_TABLET | ORAL | Status: DC | PRN

## 2012-09-22 MED ORDER — IBUPROFEN 600 MG PO TABS: 600.0000 mg | ORAL_TABLET | Freq: Four times a day (QID) | ORAL | Status: DC | PRN

## 2012-09-22 MED ORDER — SENNOSIDES-DOCUSATE SODIUM 8.6-50 MG PO TABS: 2.0000 | ORAL_TABLET | Freq: Every day | ORAL | Status: DC

## 2012-09-22 MED ORDER — POLYSACCHARIDE IRON COMPLEX 150 MG PO CAPS: 150.0000 mg | ORAL_CAPSULE | Freq: Every day | ORAL | Status: DC

## 2012-09-22 MED ORDER — POLYSACCHARIDE IRON COMPLEX 150 MG PO CAPS
150.0000 mg | ORAL_CAPSULE | Freq: Every day | ORAL | Status: DC
  Administered 2012-09-22: 150 mg via ORAL
  Filled 2012-09-22 (×2): qty 1

## 2012-09-22 NOTE — Progress Notes (Signed)
Post Partum Day 2 NSVD without complications viable female infant Subjective: no complaints, up ad lib without syncope, voiding, tolerating PO, + flatus, +BM  Pain well controlled with po meds, taking motrin and percocet  BF: on demand Mood stable, bonding well  Objective: Blood pressure 90/52, pulse 73, temperature 97.6 F (36.4 C), temperature source Oral, resp. rate 18, height 5' 2.25" (1.581 m), weight 148 lb (67.132 kg), last menstrual period 12/06/2011, SpO2 97.00%, unknown if currently breastfeeding.  Physical Exam:  General: alert, cooperative and no distress Lungs: CTAB Heart: RRR Lochia: appropriate Uterine Fundus: firm Perineum: DVT Evaluation: No evidence of DVT seen on physical exam. Negative Homan's sign. No cords or calf tenderness. No significant calf/ankle edema.   Recent Labs  09/21/12 1900 09/22/12 0500  HGB 10.6* 10.0*  HCT 32.7* 31.3*   Anemia of pregnancy - delivered Commence on Niferex 150 mgs po daily  Assessment/Plan: Plan for discharge tomorrow, Breastfeeding and Lactation consult Plans hospital circumcision.  Today Dr. Seymour Bars.      LOS: 1 day   Mary Hood 09/22/2012, 12:21 PM

## 2012-09-22 NOTE — Anesthesia Postprocedure Evaluation (Signed)
  Anesthesia Post Note  Patient: Mary Hood  Procedure(s) Performed: * No procedures listed *  Anesthesia type: Epidural  Patient location: Mother/Baby  Post pain: Pain level controlled  Post assessment: Post-op Vital signs reviewed  Last Vitals:  Filed Vitals:   09/22/12 0525  BP: 90/52  Pulse: 73  Temp: 36.4 C  Resp: 18    Post vital signs: Reviewed  Level of consciousness:alert  Complications: No apparent anesthesia complications

## 2012-09-22 NOTE — Discharge Summary (Signed)
Physician Discharge Summary  Patient ID: Mary Hood MRN: [id number] DOB/AGE: Dec 24, 1980 32 y.o.  Admit date: 09/21/2012 Discharge date: 09/22/2012  Admission Diagnoses: Admitted for scheduled IOL for post dates at [redacted]w[redacted]d   Discharge Diagnoses:  Principal Problem:   NSVD (normal spontaneous vaginal delivery) Active Problems:   Anemia of mother in pregnancy, delivered   Discharged Condition: stable  Hospital Course: uncomplicated while hospitalized and desired early discharge at 24 hrs postpartum  Consults: None  Significant Diagnostic Studies: labs: Routine labs - noneand radiology: Ultrasound: Anatomy - normal  Treatments: IV hydration and analgesia: acetaminophen w/ codeine and ibuprofen  Discharge Exam: Blood pressure 90/52, pulse 73, temperature 97.6 F (36.4 C), temperature source Oral, resp. rate 18, height 5' 2.25" (1.581 m), weight 148 lb (67.132 kg), last menstrual period 12/06/2011, SpO2 97.00%, unknown if currently breastfeeding.  Physical Examination:  General appearance: alert, cooperative and no distress Affect: AAO x 3 Lungs: CTAB Breasts: CTAB CV: RRR Abdomen: Soft, N/T B/s x 4 Fundus: -2/u Lochia: Appropriate, Rubra Perineum: tender but healing well - using stool softener at present to help with 1st BM Extremities: No edema/ swelling  - upper/lower limbs   Disposition: 01-Home or [facility]  Discharge Orders   Future Orders Complete By Expires     Diet general  As directed     Discharge instructions  As directed     Comments:      Per [facility] Booklet        Medication List    STOP taking these medications       acetaminophen 500 MG tablet  Commonly known as:  TYLENOL     azithromycin 250 MG tablet  Commonly known as:  ZITHROMAX      TAKE these medications       ibuprofen 600 MG tablet  Commonly known as:  ADVIL,MOTRIN  Take 1 tablet (600 mg total) by mouth every 6 (six) hours as needed for pain.     Iron Polysacch Cmplx-B12-FA  150-0.025-1 MG Caps  Take 150 mg by mouth 2 (two) times daily.     iron polysaccharides 150 MG capsule  Commonly known as:  NIFEREX  Take 1 capsule (150 mg total) by mouth daily.     oxyCODONE-acetaminophen 5-325 MG per tablet  Commonly known as:  PERCOCET/ROXICET  Take 1-2 tablets by mouth every 4 (four) hours as needed.     prenatal multivitamin Tabs  Take 1 tablet by mouth daily.     senna-docusate 8.6-50 MG per tablet  Commonly known as:  Senokot-S  Take 2 tablets by mouth at bedtime.     valACYclovir 500 MG tablet  Commonly known as:  VALTREX  Take 500 mg by mouth daily.           Follow-up Information   Follow up with [facility] Schedule an appointment as soon as possible for a visit in 6 weeks. (As needed)    Contact information:   [address] [place] [address] [phone]      Signed: Earl Gala 09/22/2012, 1:02 PM

## 2012-09-23 LAB — RH IG WORKUP (INCLUDES ABO/RH): Gestational Age(Wks): 41.3

## 2012-09-24 LAB — TYPE AND SCREEN
ABO/RH(D): A NEG
DAT, IgG: NEGATIVE
Unit division: 0

## 2012-12-10 ENCOUNTER — Ambulatory Visit (INDEPENDENT_AMBULATORY_CARE_PROVIDER_SITE_OTHER): Payer: BC Managed Care – PPO | Admitting: Family Medicine

## 2012-12-10 ENCOUNTER — Encounter: Payer: Self-pay | Admitting: Family Medicine

## 2012-12-10 VITALS — BP 122/75 | HR 52 | Wt 125.0 lb

## 2012-12-10 DIAGNOSIS — M79661 Pain in right lower leg: Secondary | ICD-10-CM

## 2012-12-10 DIAGNOSIS — M79609 Pain in unspecified limb: Secondary | ICD-10-CM

## 2012-12-10 NOTE — Patient Instructions (Addendum)
You have a calf strain. Compression sleeve or ace wrap to help with swelling and pain. Ice or heat 15 minutes at a time 3-4 times a day. Consider heel lifts in running shoes to relax calf muscle. Consider tylenol as needed for pain. Calf raises all 3 directions - work way up to 3 sets of 30 once a day. Internal rotation 3 sets of 10 with theraband once a day. Cycling with low resistance or swimming for cross training in meantime. Try resuming running in 3-4 weeks - 1:1 WUJ:WJXB for 10 minutes to start with.  Increase jog time by 1 minutes (so 2:1 JYN:WGNF for second run) and total run time by 5 minutes every other day. Follow up with me in 5-6 weeks for reevaluation.

## 2012-12-11 ENCOUNTER — Encounter: Payer: Self-pay | Admitting: Family Medicine

## 2012-12-11 DIAGNOSIS — M79669 Pain in unspecified lower leg: Secondary | ICD-10-CM | POA: Insufficient documentation

## 2012-12-11 NOTE — Progress Notes (Signed)
Subjective:    Patient ID: Mary Hood, female    DOB: 12-21-1980, 32 y.o.   MRN: [id number]  PCP: None listed  HPI 32 yo F here for right calf injury.  Patient reports symptoms started about 3-4 weeks ago. She states while running at that time had some tightness in right calf about 1 mile into run. She had to stop because pain intensified. Bad enough the next 1-2 days that was difficult to put weight on this leg. She rested, iced, elevated for 1 week then tried running again - 1/2 mile into  Jog had to stop again due to pain. No swelling, bruising. Only bothers her with activity. Pain in lateral right calf only. No respiratory symptoms, warmth, redness. Had similar problems previously but not bad enough to keep her from running.  Past Medical History  Diagnosis Date  . Personal history of diseases of blood and blood-forming organs     thrombophilia  . Hx of varicella   . Vulval varices   . Herpes simplex without mention of complication   . Thrombocytopenia, unspecified   . Anemia     Current Outpatient Prescriptions on File Prior to Visit  Medication Sig Dispense Refill  . ibuprofen (ADVIL,MOTRIN) 600 MG tablet Take 1 tablet (600 mg total) by mouth every 6 (six) hours as needed for pain.  30 tablet  2  . Iron Polysacch Cmplx-B12-FA 150-0.025-1 MG CAPS Take 150 mg by mouth 2 (two) times daily.  60 each  3  . iron polysaccharides (NIFEREX) 150 MG capsule Take 1 capsule (150 mg total) by mouth daily.  30 capsule  2  . oxyCODONE-acetaminophen (PERCOCET/ROXICET) 5-325 MG per tablet Take 1-2 tablets by mouth every 4 (four) hours as needed.  20 tablet  0  . Prenatal Vit-Fe Fumarate-FA (PRENATAL MULTIVITAMIN) TABS Take 1 tablet by mouth daily.      Marland Kitchen senna-docusate (SENOKOT-S) 8.6-50 MG per tablet Take 2 tablets by mouth at bedtime.  60 tablet  2  . valACYclovir (VALTREX) 500 MG tablet Take 500 mg by mouth daily.       No current facility-administered medications on file prior to  visit.    Past Surgical History  Procedure Laterality Date  . Cesarean section      No Known Allergies  History   Social History  . Marital Status: Married    Spouse Name: N/A    Number of Children: N/A  . Years of Education: N/A   Occupational History  . Not on file.   Social History Main Topics  . Smoking status: Never Smoker   . Smokeless tobacco: Not on file  . Alcohol Use: No  . Drug Use: No  . Sexually Active: Yes   Other Topics Concern  . Not on file   Social History Narrative  . No narrative on file    Family History  Problem Relation Age of Onset  . Diabetes Father   . Migraines Sister   . Hyperlipidemia Sister   . Cancer Maternal Grandmother     pancreatic  . Heart attack Neg Hx   . Hypertension Neg Hx     BP 122/75  Pulse 52  Wt 125 lb (56.7 kg)  BMI 22.68 kg/m2  Review of Systems See HPI above.    Objective:   Physical Exam Gen: NAD  R leg: No gross deformity, warmth, swelling, bruising, cords. TTP lateral gastroc reproducing her pain.  No achilles, other tenderness. Pain with active dorsiflexion and plantar flexion -  pain with calf raise. FROM ankle, knee. Pain with ext rotation of ankle also. NVI distally. Negative thompsons.    Assessment & Plan:  1. Right calf strain - no evidence DVT and history of exertional pain, location, exam otherwise goes against this.  Start with compression sleeve, heel lifts, tylenol as needed for pain.  Start calf strengthening and internal rotation strengthening exercises which were demonstrated today.  Cross train with cycling or swimming in meantime.  F/u in 5-6 weeks for reevaluation.  Declined PT for now.  Discussed return to running protocol.

## 2012-12-11 NOTE — Assessment & Plan Note (Signed)
2/2 calf strain.  no evidence DVT and history of exertional pain, location, exam otherwise goes against this.  Start with compression sleeve, heel lifts, tylenol as needed for pain.  Start calf strengthening and internal rotation strengthening exercises which were demonstrated today.  Cross train with cycling or swimming in meantime.  F/u in 5-6 weeks for reevaluation.  Declined PT for now.  Discussed return to running protocol.

## 2013-01-14 ENCOUNTER — Ambulatory Visit: Payer: BC Managed Care – PPO | Admitting: Family Medicine

## 2013-06-20 ENCOUNTER — Other Ambulatory Visit: Payer: Self-pay

## 2014-06-16 ENCOUNTER — Encounter: Payer: Self-pay | Admitting: Family Medicine

## 2016-06-02 DIAGNOSIS — Z0000 Encounter for general adult medical examination without abnormal findings: Secondary | ICD-10-CM | POA: Diagnosis not present

## 2016-06-02 DIAGNOSIS — Z1389 Encounter for screening for other disorder: Secondary | ICD-10-CM | POA: Diagnosis not present

## 2016-06-02 DIAGNOSIS — Z1329 Encounter for screening for other suspected endocrine disorder: Secondary | ICD-10-CM | POA: Diagnosis not present

## 2016-06-02 DIAGNOSIS — Z13220 Encounter for screening for lipoid disorders: Secondary | ICD-10-CM | POA: Diagnosis not present

## 2016-06-02 DIAGNOSIS — Z130 Encounter for screening for diseases of the blood and blood-forming organs and certain disorders involving the immune mechanism: Secondary | ICD-10-CM | POA: Diagnosis not present

## 2016-07-12 DIAGNOSIS — D72819 Decreased white blood cell count, unspecified: Secondary | ICD-10-CM | POA: Diagnosis not present

## 2016-10-13 DIAGNOSIS — H6981 Other specified disorders of Eustachian tube, right ear: Secondary | ICD-10-CM | POA: Diagnosis not present

## 2016-10-13 DIAGNOSIS — R42 Dizziness and giddiness: Secondary | ICD-10-CM | POA: Diagnosis not present

## 2016-10-13 DIAGNOSIS — R110 Nausea: Secondary | ICD-10-CM | POA: Diagnosis not present

## 2016-11-03 DIAGNOSIS — Z130 Encounter for screening for diseases of the blood and blood-forming organs and certain disorders involving the immune mechanism: Secondary | ICD-10-CM | POA: Diagnosis not present

## 2016-12-09 DIAGNOSIS — R42 Dizziness and giddiness: Secondary | ICD-10-CM | POA: Diagnosis not present

## 2016-12-09 DIAGNOSIS — Z0000 Encounter for general adult medical examination without abnormal findings: Secondary | ICD-10-CM | POA: Diagnosis not present

## 2016-12-09 DIAGNOSIS — Z862 Personal history of diseases of the blood and blood-forming organs and certain disorders involving the immune mechanism: Secondary | ICD-10-CM | POA: Diagnosis not present

## 2016-12-09 DIAGNOSIS — R5381 Other malaise: Secondary | ICD-10-CM | POA: Diagnosis not present

## 2016-12-15 DIAGNOSIS — R5383 Other fatigue: Secondary | ICD-10-CM | POA: Diagnosis not present

## 2016-12-15 DIAGNOSIS — R5381 Other malaise: Secondary | ICD-10-CM | POA: Diagnosis not present

## 2016-12-15 DIAGNOSIS — Z862 Personal history of diseases of the blood and blood-forming organs and certain disorders involving the immune mechanism: Secondary | ICD-10-CM | POA: Diagnosis not present

## 2016-12-15 DIAGNOSIS — Z13220 Encounter for screening for lipoid disorders: Secondary | ICD-10-CM | POA: Diagnosis not present

## 2016-12-15 DIAGNOSIS — R42 Dizziness and giddiness: Secondary | ICD-10-CM | POA: Diagnosis not present

## 2017-08-25 ENCOUNTER — Encounter: Payer: Self-pay | Admitting: Obstetrics & Gynecology

## 2017-08-25 ENCOUNTER — Ambulatory Visit (INDEPENDENT_AMBULATORY_CARE_PROVIDER_SITE_OTHER): Payer: BLUE CROSS/BLUE SHIELD | Admitting: Obstetrics & Gynecology

## 2017-08-25 VITALS — BP 108/68 | Ht 63.0 in | Wt 127.0 lb

## 2017-08-25 DIAGNOSIS — Z01419 Encounter for gynecological examination (general) (routine) without abnormal findings: Secondary | ICD-10-CM

## 2017-08-25 DIAGNOSIS — Z3009 Encounter for other general counseling and advice on contraception: Secondary | ICD-10-CM | POA: Diagnosis not present

## 2017-08-25 NOTE — Progress Notes (Signed)
Mary Hood August 20, 1980 [id number]   History:    37 y.o. G3P3L3 Married.  Sons 5 and 29 yo, daughter 95 yo.  RP:  Established patient presenting for annual gyn exam   HPI: Menses regular normal every month.  No pelvic pain.  Normal vaginal secretions.  Using condoms for contraception, satisfied.  Breasts normal.  Urine and bowel movements normal.  Patient is very fit, running.  Body mass index 22.5.  Past medical history,surgical history, family history and social history were all reviewed and documented in the EPIC chart.  Gynecologic History Patient's last menstrual period was 08/19/2017. Contraception: condoms Last Pap: 01/2015. Results were: Negative/HPV HR neg Last mammogram: Never.   Obstetric History OB History  Gravida Para Term Preterm AB Living  3 3 3     3   SAB TAB Ectopic Multiple Live Births          3    # Outcome Date GA Lbr Len/2nd Weight Sex Delivery Anes PTL Lv  3 Term 09/21/12 [redacted]w[redacted]d 07:05 / 00:22 7 lb 4.2 oz (3.295 kg) M VBAC EPI  LIV  2 Term 2009 [redacted]w[redacted]d 16:00 6 lb 7 oz (2.92 kg) M VBAC EPI  LIV  1 Term 2007 [redacted]w[redacted]d  7 lb 14 oz (3.572 kg)  CS-LTranv EPI  LIV     Birth Comments: CPD       ROS: A ROS was performed and pertinent positives and negatives are included in the history.  GENERAL: No fevers or chills. HEENT: No change in vision, no earache, sore throat or sinus congestion. NECK: No pain or stiffness. CARDIOVASCULAR: No chest pain or pressure. No palpitations. PULMONARY: No shortness of breath, cough or wheeze. GASTROINTESTINAL: No abdominal pain, nausea, vomiting or diarrhea, melena or bright red blood per rectum. GENITOURINARY: No urinary frequency, urgency, hesitancy or dysuria. MUSCULOSKELETAL: No joint or muscle pain, no back pain, no recent trauma. DERMATOLOGIC: No rash, no itching, no lesions. ENDOCRINE: No polyuria, polydipsia, no heat or cold intolerance. No recent change in weight. HEMATOLOGICAL: No anemia or easy bruising or bleeding. NEUROLOGIC:  No headache, seizures, numbness, tingling or weakness. PSYCHIATRIC: No depression, no loss of interest in normal activity or change in sleep pattern.     Exam:   BP 108/68   Ht 5\' 3"  (1.6 m)   Wt 127 lb (57.6 kg)   LMP 08/19/2017 Comment: no birth control  BMI 22.50 kg/m   Body mass index is 22.5 kg/m.  General appearance : Well developed well nourished female. No acute distress HEENT: Eyes: no retinal hemorrhage or exudates,  Neck supple, trachea midline, no carotid bruits, no thyroidmegaly Lungs: Clear to auscultation, no rhonchi or wheezes, or rib retractions  Heart: Regular rate and rhythm, no murmurs or gallops Breast:Examined in sitting and supine position were symmetrical in appearance, no palpable masses or tenderness,  no skin retraction, no nipple inversion, no nipple discharge, no skin discoloration, no axillary or supraclavicular lymphadenopathy Abdomen: no palpable masses or tenderness, no rebound or guarding Extremities: no edema or skin discoloration or tenderness  Pelvic:  Bartholin, Urethra, Skene Glands: Within normal limits             Vagina: No gross lesions or discharge  Cervix: No gross lesions or discharge.  Pap reflex done.  Uterus  AV, normal size, shape and consistency, non-tender and mobile  Adnexa  Without masses or tenderness  Anus and perineum  normal     Assessment/Plan:  37 y.o. female for annual exam  1. Encounter for routine gynecological examination with Papanicolaou smear of cervix Normal gynecologic exam.  Pap reflex done.  Breast exam normal.  Continue with regular physical activity.  2. Encounter for other general counseling or advice on contraception Satisfied with condom use for contraception.  Declines alternatives.  Genia DelMarie-Lyne Najae Rathert MD, 9:46 AM 08/25/2017

## 2017-08-25 NOTE — Patient Instructions (Signed)
1. Encounter for routine gynecological examination with Papanicolaou smear of cervix Normal gynecologic exam.  Pap reflex done.  Breast exam normal.  Continue with regular physical activity.  2. Encounter for other general counseling or advice on contraception Satisfied with condom use for contraception.  Declines alternatives.  Mary Hood, it was a pleasure seeing you today!  I will inform you of your results as soon as they are available.  Health Maintenance, Female Adopting a healthy lifestyle and getting preventive care can go a long way to promote health and wellness. Talk with your health care provider about what schedule of regular examinations is right for you. This is a good chance for you to check in with your provider about disease prevention and staying healthy. In between checkups, there are plenty of things you can do on your own. Experts have done a lot of research about which lifestyle changes and preventive measures are most likely to keep you healthy. Ask your health care provider for more information. Weight and diet Eat a healthy diet  Be sure to include plenty of vegetables, fruits, low-fat dairy products, and lean protein.  Do not eat a lot of foods high in solid fats, added sugars, or salt.  Get regular exercise. This is one of the most important things you can do for your health. ? Most adults should exercise for at least 150 minutes each week. The exercise should increase your heart rate and make you sweat (moderate-intensity exercise). ? Most adults should also do strengthening exercises at least twice a week. This is in addition to the moderate-intensity exercise.  Maintain a healthy weight  Body mass index (BMI) is a measurement that can be used to identify possible weight problems. It estimates body fat based on height and weight. Your health care provider can help determine your BMI and help you achieve or maintain a healthy weight.  For females 47 years of age and  older: ? A BMI below 18.5 is considered underweight. ? A BMI of 18.5 to 24.9 is normal. ? A BMI of 25 to 29.9 is considered overweight. ? A BMI of 30 and above is considered obese.  Watch levels of cholesterol and blood lipids  You should start having your blood tested for lipids and cholesterol at 39 years of age, then have this test every 5 years.  You may need to have your cholesterol levels checked more often if: ? Your lipid or cholesterol levels are high. ? You are older than 39 years of age. ? You are at high risk for heart disease.  Cancer screening Lung Cancer  Lung cancer screening is recommended for adults 44-89 years old who are at high risk for lung cancer because of a history of smoking.  A yearly low-dose CT scan of the lungs is recommended for people who: ? Currently smoke. ? Have quit within the past 15 years. ? Have at least a 30-pack-year history of smoking. A pack year is smoking an average of one pack of cigarettes a day for 1 year.  Yearly screening should continue until it has been 15 years since you quit.  Yearly screening should stop if you develop a health problem that would prevent you from having lung cancer treatment.  Breast Cancer  Practice breast self-awareness. This means understanding how your breasts normally appear and feel.  It also means doing regular breast self-exams. Let your health care provider know about any changes, no matter how small.  If you are in your 59s  or 13s, you should have a clinical breast exam (CBE) by a health care provider every 1-3 years as part of a regular health exam.  If you are 77 or older, have a CBE every year. Also consider having a breast X-ray (mammogram) every year.  If you have a family history of breast cancer, talk to your health care provider about genetic screening.  If you are at high risk for breast cancer, talk to your health care provider about having an MRI and a mammogram every year.  Breast  cancer gene (BRCA) assessment is recommended for women who have family members with BRCA-related cancers. BRCA-related cancers include: ? Breast. ? Ovarian. ? Tubal. ? Peritoneal cancers.  Results of the assessment will determine the need for genetic counseling and BRCA1 and BRCA2 testing.  Cervical Cancer Your health care provider may recommend that you be screened regularly for cancer of the pelvic organs (ovaries, uterus, and vagina). This screening involves a pelvic examination, including checking for microscopic changes to the surface of your cervix (Pap test). You may be encouraged to have this screening done every 3 years, beginning at age 16.  For women ages 108-65, health care providers may recommend pelvic exams and Pap testing every 3 years, or they may recommend the Pap and pelvic exam, combined with testing for human papilloma virus (HPV), every 5 years. Some types of HPV increase your risk of cervical cancer. Testing for HPV may also be done on women of any age with unclear Pap test results.  Other health care providers may not recommend any screening for nonpregnant women who are considered low risk for pelvic cancer and who do not have symptoms. Ask your health care provider if a screening pelvic exam is right for you.  If you have had past treatment for cervical cancer or a condition that could lead to cancer, you need Pap tests and screening for cancer for at least 20 years after your treatment. If Pap tests have been discontinued, your risk factors (such as having a new sexual partner) need to be reassessed to determine if screening should resume. Some women have medical problems that increase the chance of getting cervical cancer. In these cases, your health care provider may recommend more frequent screening and Pap tests.  Colorectal Cancer  This type of cancer can be detected and often prevented.  Routine colorectal cancer screening usually begins at 39 years of age and  continues through 39 years of age.  Your health care provider may recommend screening at an earlier age if you have risk factors for colon cancer.  Your health care provider may also recommend using home test kits to check for hidden blood in the stool.  A small camera at the end of a tube can be used to examine your colon directly (sigmoidoscopy or colonoscopy). This is done to check for the earliest forms of colorectal cancer.  Routine screening usually begins at age 20.  Direct examination of the colon should be repeated every 5-10 years through 39 years of age. However, you may need to be screened more often if early forms of precancerous polyps or small growths are found.  Skin Cancer  Check your skin from head to toe regularly.  Tell your health care provider about any new moles or changes in moles, especially if there is a change in a mole's shape or color.  Also tell your health care provider if you have a mole that is larger than the size of a  pencil eraser.  Always use sunscreen. Apply sunscreen liberally and repeatedly throughout the day.  Protect yourself by wearing long sleeves, pants, a wide-brimmed hat, and sunglasses whenever you are outside.  Heart disease, diabetes, and high blood pressure  High blood pressure causes heart disease and increases the risk of stroke. High blood pressure is more likely to develop in: ? People who have blood pressure in the high end of the normal range (130-139/85-89 mm Hg). ? People who are overweight or obese. ? People who are African American.  If you are 63-76 years of age, have your blood pressure checked every 3-5 years. If you are 34 years of age or older, have your blood pressure checked every year. You should have your blood pressure measured twice-once when you are at a hospital or clinic, and once when you are not at a hospital or clinic. Record the average of the two measurements. To check your blood pressure when you are not  at a hospital or clinic, you can use: ? An automated blood pressure machine at a pharmacy. ? A home blood pressure monitor.  If you are between 65 years and 81 years old, ask your health care provider if you should take aspirin to prevent strokes.  Have regular diabetes screenings. This involves taking a blood sample to check your fasting blood sugar level. ? If you are at a normal weight and have a low risk for diabetes, have this test once every three years after 39 years of age. ? If you are overweight and have a high risk for diabetes, consider being tested at a younger age or more often. Preventing infection Hepatitis B  If you have a higher risk for hepatitis B, you should be screened for this virus. You are considered at high risk for hepatitis B if: ? You were born in a country where hepatitis B is common. Ask your health care provider which countries are considered high risk. ? Your parents were born in a high-risk country, and you have not been immunized against hepatitis B (hepatitis B vaccine). ? You have HIV or AIDS. ? You use needles to inject street drugs. ? You live with someone who has hepatitis B. ? You have had sex with someone who has hepatitis B. ? You get hemodialysis treatment. ? You take certain medicines for conditions, including cancer, organ transplantation, and autoimmune conditions.  Hepatitis C  Blood testing is recommended for: ? Everyone born from [birth year] through 1967. ? Anyone with known risk factors for hepatitis C.  Sexually transmitted infections (STIs)  You should be screened for sexually transmitted infections (STIs) including gonorrhea and chlamydia if: ? You are sexually active and are younger than 39 years of age. ? You are older than 39 years of age and your health care provider tells you that you are at risk for this type of infection. ? Your sexual activity has changed since you were last screened and you are at an increased risk for chlamydia  or gonorrhea. Ask your health care provider if you are at risk.  If you do not have HIV, but are at risk, it may be recommended that you take a prescription medicine daily to prevent HIV infection. This is called pre-exposure prophylaxis (PrEP). You are considered at risk if: ? You are sexually active and do not regularly use condoms or know the HIV status of your partner(s). ? You take drugs by injection. ? You are sexually active with a partner who has HIV.  Talk with your health care provider about whether you are at high risk of being infected with HIV. If you choose to begin PrEP, you should first be tested for HIV. You should then be tested every 3 months for as long as you are taking PrEP. Pregnancy  If you are premenopausal and you may become pregnant, ask your health care provider about preconception counseling.  If you may become pregnant, take 400 to 800 micrograms (mcg) of folic acid every day.  If you want to prevent pregnancy, talk to your health care provider about birth control (contraception). Osteoporosis and menopause  Osteoporosis is a disease in which the bones lose minerals and strength with aging. This can result in serious bone fractures. Your risk for osteoporosis can be identified using a bone density scan.  If you are 73 years of age or older, or if you are at risk for osteoporosis and fractures, ask your health care provider if you should be screened.  Ask your health care provider whether you should take a calcium or vitamin D supplement to lower your risk for osteoporosis.  Menopause may have certain physical symptoms and risks.  Hormone replacement therapy may reduce some of these symptoms and risks. Talk to your health care provider about whether hormone replacement therapy is right for you. Follow these instructions at home:  Schedule regular health, dental, and eye exams.  Stay current with your immunizations.  Do not use any tobacco products  including cigarettes, chewing tobacco, or electronic cigarettes.  If you are pregnant, do not drink alcohol.  If you are breastfeeding, limit how much and how often you drink alcohol.  Limit alcohol intake to no more than 1 drink per day for nonpregnant women. One drink equals 12 ounces of beer, 5 ounces of wine, or 1 ounces of hard liquor.  Do not use street drugs.  Do not share needles.  Ask your health care provider for help if you need support or information about quitting drugs.  Tell your health care provider if you often feel depressed.  Tell your health care provider if you have ever been abused or do not feel safe at home. This information is not intended to replace advice given to you by your health care provider. Make sure you discuss any questions you have with your health care provider. Document Released: 02/14/2011 Document Revised: 01/07/2016 Document Reviewed: 05/05/2015 Elsevier Interactive Patient Education  [employer].

## 2017-08-25 NOTE — Addendum Note (Signed)
Addended by: Berna SpareASTILLO, BLANCA A on: 08/25/2017 10:11 AM   Modules accepted: Orders

## 2017-08-29 LAB — PAP IG W/ RFLX HPV ASCU

## 2017-09-04 ENCOUNTER — Encounter: Payer: Self-pay | Admitting: *Deleted

## 2018-08-28 ENCOUNTER — Encounter: Payer: BLUE CROSS/BLUE SHIELD | Admitting: Obstetrics & Gynecology

## 2018-09-27 ENCOUNTER — Ambulatory Visit (INDEPENDENT_AMBULATORY_CARE_PROVIDER_SITE_OTHER): Payer: BLUE CROSS/BLUE SHIELD | Admitting: Obstetrics & Gynecology

## 2018-09-27 ENCOUNTER — Encounter: Payer: Self-pay | Admitting: Obstetrics & Gynecology

## 2018-09-27 VITALS — BP 122/80 | Wt 129.0 lb

## 2018-09-27 DIAGNOSIS — Z01419 Encounter for gynecological examination (general) (routine) without abnormal findings: Secondary | ICD-10-CM | POA: Diagnosis not present

## 2018-09-27 DIAGNOSIS — Z789 Other specified health status: Secondary | ICD-10-CM | POA: Diagnosis not present

## 2018-09-27 NOTE — Patient Instructions (Signed)
1. Well female exam with routine gynecological exam Normal gynecologic exam.  Pap test negative 08/2017, no indication to repeat this year.  Breasts normal.  [facility] 38 [facility].  BMI good at 22.85.  Continue with fitness and healthy nutrition.  [facility] with Fam MD.  2. Use of condoms for contraception  Buford, as always, good seeing you today!  :-)

## 2018-09-27 NOTE — Progress Notes (Signed)
Mary Hood 07/18/1981 [id number]   History:    38 y.o. G3P3L3 Married.  2 sons 2 and 43 yo.  Daughter 68 yo.  RP:  Established patient presenting for annual gyn exam   HPI: Menstrual.'s every months with normal flow.  No breakthrough bleeding.  Satisfied with condoms for contraception.  No pelvic pain and no pain with intercourse.  Urine and bowel movements normal.  Breasts normal.  Body mass index 22.85.  Very fit with regular running and weights.  Health labs with family physician.  Past medical history,surgical history, family history and social history were all reviewed and documented in the EPIC chart.  Gynecologic History Patient's last menstrual period was 09/22/2018. Contraception: condoms Last Pap: 08/2017. Results were: Negative Last mammogram: Will schedule this year at 38 yo Bone Density: Never Colonoscopy: Never  Obstetric History OB History  Gravida Para Term Preterm AB Living  3 3 3     3   SAB TAB Ectopic Multiple Live Births          3    # Outcome Date GA Lbr Len/2nd Weight Sex Delivery Anes PTL Lv  3 Term 09/21/12 [redacted]w[redacted]d 07:05 / 00:22 7 lb 4.2 oz (3.295 kg) M VBAC EPI  LIV  2 Term 2009 [redacted]w[redacted]d 16:00 6 lb 7 oz (2.92 kg) M VBAC EPI  LIV  1 Term 2007 [redacted]w[redacted]d  7 lb 14 oz (3.572 kg)  CS-LTranv EPI  LIV     Birth Comments: CPD     ROS: A ROS was performed and pertinent positives and negatives are included in the history.  GENERAL: No fevers or chills. HEENT: No change in vision, no earache, sore throat or sinus congestion. NECK: No pain or stiffness. CARDIOVASCULAR: No chest pain or pressure. No palpitations. PULMONARY: No shortness of breath, cough or wheeze. GASTROINTESTINAL: No abdominal pain, nausea, vomiting or diarrhea, melena or bright red blood per rectum. GENITOURINARY: No urinary frequency, urgency, hesitancy or dysuria. MUSCULOSKELETAL: No joint or muscle pain, no back pain, no recent trauma. DERMATOLOGIC: No rash, no itching, no lesions. ENDOCRINE: No  polyuria, polydipsia, no heat or cold intolerance. No recent change in weight. HEMATOLOGICAL: No anemia or easy bruising or bleeding. NEUROLOGIC: No headache, seizures, numbness, tingling or weakness. PSYCHIATRIC: No depression, no loss of interest in normal activity or change in sleep pattern.     Exam:   BP 122/80 (BP Location: Right Arm, Patient Position: Sitting, Cuff Size: Normal)   Wt 129 lb (58.5 kg)   LMP 09/22/2018   BMI 22.85 kg/m   Body mass index is 22.85 kg/m.  General appearance : Well developed well nourished female. No acute distress HEENT: Eyes: no retinal hemorrhage or exudates,  Neck supple, trachea midline, no carotid bruits, no thyroidmegaly Lungs: Clear to auscultation, no rhonchi or wheezes, or rib retractions  Heart: Regular rate and rhythm, no murmurs or gallops Breast:Examined in sitting and supine position were symmetrical in appearance, no palpable masses or tenderness,  no skin retraction, no nipple inversion, no nipple discharge, no skin discoloration, no axillary or supraclavicular lymphadenopathy Abdomen: no palpable masses or tenderness, no rebound or guarding Extremities: no edema or skin discoloration or tenderness  Pelvic: Vulva: Normal             Vagina: No gross lesions or discharge  Cervix: No gross lesions or discharge  Uterus  RV, normal size, shape and consistency, non-tender and mobile  Adnexa  Without masses or tenderness  Anus: Normal   Assessment/Plan:  38 y.o. female for annual exam   1. Well female exam with routine gynecological exam Normal gynecologic exam.  Pap test negative 08/2017, no indication to repeat this year.  Breasts normal.  [facility] when 38 [facility].  BMI good at 22.85.  Continue with fitness and healthy nutrition.  Health Labs with Fam MD.  2. Use of condoms for contraception  [name] MD, 8:23 AM 09/27/2018

## 2019-02-02 DIAGNOSIS — U071 COVID-19: Secondary | ICD-10-CM | POA: Diagnosis not present

## 2019-02-03 DIAGNOSIS — R51 Headache: Secondary | ICD-10-CM | POA: Diagnosis not present

## 2019-02-03 DIAGNOSIS — R05 Cough: Secondary | ICD-10-CM | POA: Diagnosis not present

## 2019-02-19 ENCOUNTER — Other Ambulatory Visit: Payer: Self-pay | Admitting: Nurse Practitioner

## 2019-02-19 DIAGNOSIS — Z1231 Encounter for screening mammogram for malignant neoplasm of breast: Secondary | ICD-10-CM

## 2019-04-04 ENCOUNTER — Other Ambulatory Visit: Payer: Self-pay

## 2019-04-04 ENCOUNTER — Ambulatory Visit
Admission: RE | Admit: 2019-04-04 | Discharge: 2019-04-04 | Disposition: A | Discharge status: Home / Self Care | Payer: BC Managed Care – PPO | Source: Ambulatory Visit | Attending: Nurse Practitioner | Admitting: Nurse Practitioner

## 2019-04-04 DIAGNOSIS — Z1231 Encounter for screening mammogram for malignant neoplasm of breast: Secondary | ICD-10-CM

## 2019-09-30 ENCOUNTER — Encounter: Payer: BLUE CROSS/BLUE SHIELD | Admitting: Obstetrics & Gynecology

## 2019-10-04 ENCOUNTER — Other Ambulatory Visit: Payer: Self-pay

## 2019-10-07 ENCOUNTER — Other Ambulatory Visit: Payer: Self-pay

## 2019-10-07 ENCOUNTER — Encounter: Payer: Self-pay | Admitting: Obstetrics & Gynecology

## 2019-10-07 ENCOUNTER — Ambulatory Visit (INDEPENDENT_AMBULATORY_CARE_PROVIDER_SITE_OTHER): Payer: BC Managed Care – PPO | Admitting: Obstetrics & Gynecology

## 2019-10-07 VITALS — BP 110/72 | Ht 63.0 in | Wt 131.8 lb

## 2019-10-07 DIAGNOSIS — Z789 Other specified health status: Secondary | ICD-10-CM

## 2019-10-07 DIAGNOSIS — Z01419 Encounter for gynecological examination (general) (routine) without abnormal findings: Secondary | ICD-10-CM | POA: Diagnosis not present

## 2019-10-07 NOTE — Progress Notes (Signed)
Mary Hood 09/04/1980 [id number]   History:    39 y.o. G3P3L3 Married.  2 sons 33 and 5 yo.  Daughter 41 yo.  RP:  Established patient presenting for annual gyn exam   HPI: Menstrual periods every month with normal flow.  No breakthrough bleeding.  Satisfied with condoms for contraception.  No pelvic pain and no pain with intercourse.  Urine and bowel movements normal.  Breasts normal.  Body mass index 23.35.  Very fit with regular running and weights.  Health labs with family physician.   Past medical history,surgical history, family history and social history were all reviewed and documented in the EPIC chart.  Gynecologic History Patient's last menstrual period was 09/16/2019.  Obstetric History OB History  Gravida Para Term Preterm AB Living  3 3 3     3   SAB TAB Ectopic Multiple Live Births          3    # Outcome Date GA Lbr Len/2nd Weight Sex Delivery Anes PTL Lv  3 Term 09/21/12 [redacted]w[redacted]d 07:05 / 00:22 7 lb 4.2 oz (3.295 kg) M VBAC EPI  LIV  2 Term 2009 [redacted]w[redacted]d 16:00 6 lb 7 oz (2.92 kg) M VBAC EPI  LIV  1 Term 2007 [redacted]w[redacted]d  7 lb 14 oz (3.572 kg)  CS-LTranv EPI  LIV     Birth Comments: CPD     ROS: A ROS was performed and pertinent positives and negatives are included in the history.  GENERAL: No fevers or chills. HEENT: No change in vision, no earache, sore throat or sinus congestion. NECK: No pain or stiffness. CARDIOVASCULAR: No chest pain or pressure. No palpitations. PULMONARY: No shortness of breath, cough or wheeze. GASTROINTESTINAL: No abdominal pain, nausea, vomiting or diarrhea, melena or bright red blood per rectum. GENITOURINARY: No urinary frequency, urgency, hesitancy or dysuria. MUSCULOSKELETAL: No joint or muscle pain, no back pain, no recent trauma. DERMATOLOGIC: No rash, no itching, no lesions. ENDOCRINE: No polyuria, polydipsia, no heat or cold intolerance. No recent change in weight. HEMATOLOGICAL: No anemia or easy bruising or bleeding. NEUROLOGIC: No  headache, seizures, numbness, tingling or weakness. PSYCHIATRIC: No depression, no loss of interest in normal activity or change in sleep pattern.     Exam:   BP 110/72   Ht 5\' 3"  (1.6 m)   Wt 131 lb 12.8 oz (59.8 kg)   LMP 09/16/2019 Comment: no birth control  BMI 23.35 kg/m   Body mass index is 23.35 kg/m.  General appearance : Well developed well nourished female. No acute distress HEENT: Eyes: no retinal hemorrhage or exudates,  Neck supple, trachea midline, no carotid bruits, no thyroidmegaly Lungs: Clear to auscultation, no rhonchi or wheezes, or rib retractions  Heart: Regular rate and rhythm, no murmurs or gallops Breast:Examined in sitting and supine position were symmetrical in appearance, no palpable masses or tenderness,  no skin retraction, no nipple inversion, no nipple discharge, no skin discoloration, no axillary or supraclavicular lymphadenopathy Abdomen: no palpable masses or tenderness, no rebound or guarding Extremities: no edema or skin discoloration or tenderness  Pelvic: Vulva: Normal             Vagina: No gross lesions or discharge  Cervix: No gross lesions or discharge  Uterus  AV, normal size, shape and consistency, non-tender and mobile  Adnexa  Without masses or tenderness  Anus: Normal   Assessment/Plan:  39 y.o. female for annual exam   1. Well female exam with routine gynecological exam Normal  gynecologic exam.  Pap test January 2019 was negative, no indication to repeat this year.  Breast exam normal.  Screening mammogram August 2020 was negative.  Health labs with family physician.  Very good body mass index at 23.35.  Continue with fitness and healthy nutrition.  2. Use of condoms for contraception  Princess Bruins MD, 9:06 AM 10/07/2019

## 2019-10-08 ENCOUNTER — Encounter: Payer: Self-pay | Admitting: Obstetrics & Gynecology

## 2019-10-08 NOTE — Patient Instructions (Signed)
1. Well female exam with routine gynecological exam Normal gynecologic exam.  Pap test January 2019 was negative, no indication to repeat this year.  Breast exam normal.  Screening mammogram August 2020 was negative.  Health labs with family physician.  Very good body mass index at 23.35.  Continue with fitness and healthy nutrition.  2. Use of condoms for contraception  Mary Hood, it was a pleasure seeing you today!

## 2020-01-20 DIAGNOSIS — M25571 Pain in right ankle and joints of right foot: Secondary | ICD-10-CM | POA: Diagnosis not present

## 2020-01-20 DIAGNOSIS — G5761 Lesion of plantar nerve, right lower limb: Secondary | ICD-10-CM | POA: Diagnosis not present

## 2020-02-10 ENCOUNTER — Ambulatory Visit: Payer: BC Managed Care – PPO | Admitting: Podiatry

## 2020-02-20 ENCOUNTER — Other Ambulatory Visit: Payer: Self-pay | Admitting: Obstetrics & Gynecology

## 2020-02-20 DIAGNOSIS — Z1231 Encounter for screening mammogram for malignant neoplasm of breast: Secondary | ICD-10-CM

## 2020-04-09 ENCOUNTER — Other Ambulatory Visit: Payer: Self-pay

## 2020-04-09 ENCOUNTER — Ambulatory Visit
Admission: RE | Admit: 2020-04-09 | Discharge: 2020-04-09 | Disposition: A | Discharge status: Home / Self Care | Payer: BC Managed Care – PPO | Source: Ambulatory Visit | Attending: Obstetrics & Gynecology | Admitting: Obstetrics & Gynecology

## 2020-04-09 ENCOUNTER — Ambulatory Visit: Payer: BC Managed Care – PPO

## 2020-04-09 DIAGNOSIS — Z1231 Encounter for screening mammogram for malignant neoplasm of breast: Secondary | ICD-10-CM

## 2020-07-28 DIAGNOSIS — Z20822 Contact with and (suspected) exposure to covid-19: Secondary | ICD-10-CM | POA: Diagnosis not present

## 2020-07-28 DIAGNOSIS — J069 Acute upper respiratory infection, unspecified: Secondary | ICD-10-CM | POA: Diagnosis not present

## 2020-07-28 DIAGNOSIS — R0981 Nasal congestion: Secondary | ICD-10-CM | POA: Diagnosis not present

## 2020-07-28 DIAGNOSIS — R059 Cough, unspecified: Secondary | ICD-10-CM | POA: Diagnosis not present

## 2020-10-07 ENCOUNTER — Ambulatory Visit (INDEPENDENT_AMBULATORY_CARE_PROVIDER_SITE_OTHER): Payer: BC Managed Care – PPO | Admitting: Obstetrics & Gynecology

## 2020-10-07 ENCOUNTER — Encounter: Payer: Self-pay | Admitting: Obstetrics & Gynecology

## 2020-10-07 ENCOUNTER — Other Ambulatory Visit: Payer: Self-pay

## 2020-10-07 VITALS — BP 110/70 | Ht 63.5 in | Wt 129.0 lb

## 2020-10-07 DIAGNOSIS — Z01419 Encounter for gynecological examination (general) (routine) without abnormal findings: Secondary | ICD-10-CM

## 2020-10-07 DIAGNOSIS — Z789 Other specified health status: Secondary | ICD-10-CM | POA: Diagnosis not present

## 2020-10-07 NOTE — Progress Notes (Signed)
Mary Hood [birth year] [id number]   History:    40 y.o. G3P3L3 Married. 2 sons 9 and 49 yo. Daughter 63 yo.  [id number] presenting for annual gyn exam   GUY:[id number] periods every month with normal flow. No breakthrough bleeding. Satisfied with condoms for contraception. No pelvic pain and no pain with intercourse. Urine and bowel movements normal. Breasts normal. Body mass index 22.49. Very fit with regular running and weights. Health labs with family physician.  Past medical history,surgical history, family history and social history were all reviewed and documented in the EPIC chart.  Gynecologic History Patient's last menstrual period was 09/23/2020.  Obstetric History OB History  Gravida Para Term Preterm AB Living  3 3 3     3   SAB IAB Ectopic Multiple Live Births          3    # Outcome Date GA Lbr Len/2nd Weight Sex Delivery Anes PTL Lv  3 Term 09/21/12 [redacted]w[redacted]d 07:05 / 00:22 7 lb 4.2 oz (3.295 kg) M VBAC EPI  LIV  2 Term 2009 [redacted]w[redacted]d 16:00 6 lb 7 oz (2.92 kg) M VBAC EPI  LIV  1 Term 2007 [redacted]w[redacted]d  7 lb 14 oz (3.572 kg)  CS-LTranv EPI  LIV     Birth Comments: CPD     ROS: A ROS was performed and pertinent positives and negatives are included in the history.  GENERAL: No fevers or chills. HEENT: No change in vision, no earache, sore throat or sinus congestion. NECK: No pain or stiffness. CARDIOVASCULAR: No chest pain or pressure. No palpitations. PULMONARY: No shortness of breath, cough or wheeze. GASTROINTESTINAL: No abdominal pain, nausea, vomiting or diarrhea, melena or bright red blood per rectum. GENITOURINARY: No urinary frequency, urgency, hesitancy or dysuria. MUSCULOSKELETAL: No joint or muscle pain, no back pain, no recent trauma. DERMATOLOGIC: No rash, no itching, no lesions. ENDOCRINE: No polyuria, polydipsia, no heat or cold intolerance. No recent change in weight. HEMATOLOGICAL: No anemia or easy bruising or bleeding. NEUROLOGIC: No  headache, seizures, numbness, tingling or weakness. PSYCHIATRIC: No depression, no loss of interest in normal activity or change in sleep pattern.     Exam:   BP 110/70   Ht 5' 3.5" (1.613 m)   Wt 129 lb (58.5 kg)   LMP 09/23/2020 Comment: no birth control  BMI 22.49 kg/m   Body mass index is 22.49 kg/m.  General appearance : Well developed well nourished female. No acute distress HEENT: Eyes: no retinal hemorrhage or exudates,  Neck supple, trachea midline, no carotid bruits, no thyroidmegaly Lungs: Clear to auscultation, no rhonchi or wheezes, or rib retractions  Heart: Regular rate and rhythm, no murmurs or gallops Breast:Examined in sitting and supine position were symmetrical in appearance, no palpable masses or tenderness,  no skin retraction, no nipple inversion, no nipple discharge, no skin discoloration, no axillary or supraclavicular lymphadenopathy Abdomen: no palpable masses or tenderness, no rebound or guarding Extremities: no edema or skin discoloration or tenderness  Pelvic: Vulva: Normal             Vagina: No gross lesions or discharge  Cervix: No gross lesions or discharge  Uterus  AV, normal size, shape and consistency, non-tender and mobile  Adnexa  Without masses or tenderness  Anus: Normal   Assessment/Plan:  40 y.o. female for annual exam   1. Well female exam with routine gynecological exam Normal gynecologic exam.  We will repeat a Pap test next year, last Pap test negative and never  had an abnormal Pap.  Breast exam normal.  Screening mammogram August 2021 was negative.  Health labs with family nurse practitioner.  Very good body mass index at 22.49.  Continue with healthy nutrition and fitness.  2. Use of condoms for contraception  Other orders - Multiple Vitamin (MULTIVITAMIN) tablet; Take 1 tablet by mouth daily.  [name] MD, 9:17 AM 10/07/2020

## 2020-10-15 DIAGNOSIS — J039 Acute tonsillitis, unspecified: Secondary | ICD-10-CM | POA: Diagnosis not present

## 2021-03-23 ENCOUNTER — Other Ambulatory Visit: Payer: Self-pay | Admitting: Obstetrics & Gynecology

## 2021-03-23 DIAGNOSIS — Z1231 Encounter for screening mammogram for malignant neoplasm of breast: Secondary | ICD-10-CM

## 2021-04-14 ENCOUNTER — Ambulatory Visit
Admission: RE | Admit: 2021-04-14 | Discharge: 2021-04-14 | Disposition: A | Discharge status: Home / Self Care | Payer: BC Managed Care – PPO | Source: Ambulatory Visit | Attending: Obstetrics & Gynecology | Admitting: Obstetrics & Gynecology

## 2021-04-14 ENCOUNTER — Other Ambulatory Visit: Payer: Self-pay

## 2021-04-14 DIAGNOSIS — Z1231 Encounter for screening mammogram for malignant neoplasm of breast: Secondary | ICD-10-CM

## 2021-07-02 ENCOUNTER — Ambulatory Visit (INDEPENDENT_AMBULATORY_CARE_PROVIDER_SITE_OTHER): Payer: BC Managed Care – PPO | Admitting: Podiatry

## 2021-07-02 ENCOUNTER — Other Ambulatory Visit: Payer: Self-pay

## 2021-07-02 DIAGNOSIS — M7750 Other enthesopathy of unspecified foot: Secondary | ICD-10-CM

## 2021-07-02 DIAGNOSIS — Q666 Other congenital valgus deformities of feet: Secondary | ICD-10-CM

## 2021-07-06 ENCOUNTER — Encounter: Payer: Self-pay | Admitting: Podiatry

## 2021-07-06 NOTE — Progress Notes (Signed)
  Subjective:  Patient ID: Mary Hood, female    DOB: 1981-06-18,  MRN: [id number]  Chief Complaint  Patient presents with   Toe Pain    Right foot 2nd toe     40 y.o. female presents with the above complaint.  Patient presents with complaint bilateral submetatarsal 2 pain.  Patient states painful to touch painful to ambulate and has progressed to gotten worse.  She states that it hurts with ambulation hurts with going barefooted.  She has not seen anyone as prior to seeing me.  She would like to discuss treatment options.  Pain scale 5 out of 10 sharp shooting in nature.   Review of Systems: Negative except as noted in the HPI. Denies N/V/F/Ch.  Past Medical History:  Diagnosis Date   Anemia    Herpes simplex without mention of complication    Hx of varicella    Personal history of diseases of blood and blood-forming organs    thrombophilia   Thrombocytopenia, unspecified (HCC)    Vulval varices     Current Outpatient Medications:    Multiple Vitamin (MULTIVITAMIN) tablet, Take 1 tablet by mouth daily., Disp: , Rfl:   Social History   Tobacco Use  Smoking Status Never  Smokeless Tobacco Never    No Known Allergies Objective:  There were no vitals filed for this visit. There is no height or weight on file to calculate BMI. Constitutional Well developed. Well nourished.  Vascular Dorsalis pedis pulses palpable bilaterally. Posterior tibial pulses palpable bilaterally. Capillary refill normal to all digits.  No cyanosis or clubbing noted. Pedal hair growth normal.  Neurologic Normal speech. Oriented to person, place, and time. Epicritic sensation to light touch grossly present bilaterally.  Dermatologic Nails well groomed and normal in appearance. No open wounds. No skin lesions.  Orthopedic: Pain on palpation of right second metatarsophalangeal joint.  Deep intra-articular second MPJ pain noted.  No crepitus appreciated.  No Mulder's click noted.  No signs of  neuroma is noted.  No extensor or flexor tendinitis noted   Radiographs: None Assessment:   1. Pes planovalgus   2. Capsulitis of metatarsophalangeal (MTP) joint    Plan:  Patient was evaluated and treated and all questions answered.  Bilateral second metatarsal MPJ capsulitis with underlying pes planovalgus deformity -I explained to the patient the etiology of MPJ capsulitis and various treatment options were discussed.  At this time I discussed with the patient shoe gear modification as well as offloading pads.  If it continues to hurt her we will discuss other treatment options.  I also discussed orthotics.  She would like to proceed with getting orthotics  Pes planovalgus -I explained to patient the etiology of pes planovalgus and relationship with second metatarsophalangeal joint capsulitis pes planovalgus and various treatment options were discussed.  Given patient foot structure in the setting of Planter fasciitis I believe patient will benefit from custom-made orthotics to help control the hindfoot motion support the arch of the foot and take the stress away from plantar fascial.  Patient agrees with the plan like to proceed with orthotics -Patient was casted for orthotics   No follow-ups on file.

## 2021-08-17 ENCOUNTER — Other Ambulatory Visit: Payer: Self-pay

## 2021-08-17 ENCOUNTER — Ambulatory Visit: Payer: BC Managed Care – PPO

## 2021-08-17 DIAGNOSIS — Q666 Other congenital valgus deformities of feet: Secondary | ICD-10-CM

## 2021-08-17 DIAGNOSIS — M7750 Other enthesopathy of unspecified foot: Secondary | ICD-10-CM

## 2021-08-17 NOTE — Progress Notes (Signed)
SITUATION: Reason for Visit: Fitting and Delivery of Custom Fabricated Foot Orthoses Patient Report: Patient reports comfort and is satisfied with device.  OBJECTIVE DATA: Patient History / Diagnosis:     ICD-10-CM   1. Pes planovalgus  Q66.6     2. Capsulitis of metatarsophalangeal (MTP) joint  M77.50       Provided Device:  Functional foot orthotics  GOAL OF ORTHOSIS - Improve gait - Decrease energy expenditure - Improve Balance - Provide Triplanar stability of foot complex - Facilitate motion  ACTIONS PERFORMED Patient was fit with foot orthotics trimmed to shoe last. Patient tolerated fittign procedure. Device was modified as follows to better fit patient: - Toe plate was trimmed to shoe last  Patient was provided with verbal and written instruction and demonstration regarding donning, doffing, wear, care, proper fit, function, purpose, cleaning, and use of the orthosis and in all related precautions and risks and benefits regarding the orthosis.  Patient was also provided with verbal instruction regarding how to report any failures or malfunctions of the orthosis and necessary follow up care. Patient was also instructed to contact our office regarding any change in status that may affect the function of the orthosis.  Patient demonstrated independence with proper donning, doffing, and fit and verbalized understanding of all instructions.  PLAN: Patient is to follow up in one week or as necessary (PRN). All questions were answered and concerns addressed. Plan of care was discussed with and agreed upon by the patient.

## 2021-10-07 ENCOUNTER — Encounter: Payer: Self-pay | Admitting: Obstetrics & Gynecology

## 2021-10-07 ENCOUNTER — Other Ambulatory Visit (HOSPITAL_COMMUNITY)
Admission: RE | Admit: 2021-10-07 | Discharge: 2021-10-07 | Disposition: A | Discharge status: Home / Self Care | Payer: BC Managed Care – PPO | Source: Ambulatory Visit | Attending: Obstetrics & Gynecology | Admitting: Obstetrics & Gynecology

## 2021-10-07 ENCOUNTER — Other Ambulatory Visit: Payer: Self-pay

## 2021-10-07 ENCOUNTER — Ambulatory Visit (INDEPENDENT_AMBULATORY_CARE_PROVIDER_SITE_OTHER): Payer: BC Managed Care – PPO | Admitting: Obstetrics & Gynecology

## 2021-10-07 VITALS — BP 116/74 | HR 72 | Resp 16 | Ht 63.25 in | Wt 129.0 lb

## 2021-10-07 DIAGNOSIS — Z789 Other specified health status: Secondary | ICD-10-CM | POA: Diagnosis not present

## 2021-10-07 DIAGNOSIS — Z01419 Encounter for gynecological examination (general) (routine) without abnormal findings: Secondary | ICD-10-CM

## 2021-10-07 NOTE — Progress Notes (Signed)
Mary Hood 05/27/1981 [id number]   History:    41 y.o. G3P3L3 Married.  2 sons 58 and 10 yo.  Daughter 31 yo.   RP:  Established patient presenting for annual gyn exam    HPI: Menstrual periods every month with normal flow.  No breakthrough bleeding.  Satisfied with condoms for contraception.  No pelvic pain and no pain with intercourse. Pap 08/2017 Neg.  Pap reflex today. Urine and bowel movements normal.  Breasts normal.  Mammo Neg 03/2021.  Body mass index 22.67.  Very fit with regular running and weights.  Health labs with family physician.   Past medical history,surgical history, family history and social history were all reviewed and documented in the EPIC chart.  Gynecologic History Patient's last menstrual period was 09/13/2021 (exact date).  Obstetric History OB History  Gravida Para Term Preterm AB Living  3 3 3     3   SAB IAB Ectopic Multiple Live Births          3    # Outcome Date GA Lbr Len/2nd Weight Sex Delivery Anes PTL Lv  3 Term 09/21/12 [redacted]w[redacted]d 07:05 / 00:22 7 lb 4.2 oz (3.295 kg) M VBAC EPI  LIV  2 Term 2009 [redacted]w[redacted]d 16:00 6 lb 7 oz (2.92 kg) M VBAC EPI  LIV  1 Term 2007 [redacted]w[redacted]d  7 lb 14 oz (3.572 kg)  CS-LTranv EPI  LIV     Birth Comments: CPD     ROS: A ROS was performed and pertinent positives and negatives are included in the history.  GENERAL: No fevers or chills. HEENT: No change in vision, no earache, sore throat or sinus congestion. NECK: No pain or stiffness. CARDIOVASCULAR: No chest pain or pressure. No palpitations. PULMONARY: No shortness of breath, cough or wheeze. GASTROINTESTINAL: No abdominal pain, nausea, vomiting or diarrhea, melena or bright red blood per rectum. GENITOURINARY: No urinary frequency, urgency, hesitancy or dysuria. MUSCULOSKELETAL: No joint or muscle pain, no back pain, no recent trauma. DERMATOLOGIC: No rash, no itching, no lesions. ENDOCRINE: No polyuria, polydipsia, no heat or cold intolerance. No recent change in weight.  HEMATOLOGICAL: No anemia or easy bruising or bleeding. NEUROLOGIC: No headache, seizures, numbness, tingling or weakness. PSYCHIATRIC: No depression, no loss of interest in normal activity or change in sleep pattern.     Exam:   BP 116/74    Pulse 72    Resp 16    Ht 5' 3.25" (1.607 m)    Wt 129 lb (58.5 kg)    LMP 09/13/2021 (Exact Date)    BMI 22.67 kg/m   Body mass index is 22.67 kg/m.  General appearance : Well developed well nourished female. No acute distress HEENT: Eyes: no retinal hemorrhage or exudates,  Neck supple, trachea midline, no carotid bruits, no thyroidmegaly Lungs: Clear to auscultation, no rhonchi or wheezes, or rib retractions  Heart: Regular rate and rhythm, no murmurs or gallops Breast:Examined in sitting and supine position were symmetrical in appearance, no palpable masses or tenderness,  no skin retraction, no nipple inversion, no nipple discharge, no skin discoloration, no axillary or supraclavicular lymphadenopathy Abdomen: no palpable masses or tenderness, no rebound or guarding Extremities: no edema or skin discoloration or tenderness  Pelvic: Vulva: Normal             Vagina: No gross lesions or discharge  Cervix: No gross lesions or discharge  Uterus  AV, normal size, shape and consistency, non-tender and mobile  Adnexa  Without masses or  tenderness  Anus: Normal   Assessment/Plan:  41 y.o. female for annual exam   1. Encounter for routine gynecological examination with Papanicolaou smear of cervix Menstrual periods every month with normal flow.  No breakthrough bleeding.  Satisfied with condoms for contraception.  No pelvic pain and no pain with intercourse. Pap 08/2017 Neg.  Pap reflex today. Urine and bowel movements normal.  Breasts normal.  Mammo Neg 03/2021.  Body mass index 22.67.  Very fit with regular running and weights.  Health labs with family physician. - Cytology - PAP( [place])  2. Use of condoms for contraception   [name] MD, 9:22 AM 10/07/2021

## 2021-10-08 LAB — CYTOLOGY - PAP: Diagnosis: NEGATIVE

## 2021-12-09 IMAGING — MG MM DIGITAL SCREENING BILAT W/ TOMO AND CAD
8 series · 9 of 24 positions shown · non-contrast
Comparison: Previous exam(s).

CLINICAL DATA: Screening.

EXAM:
DIGITAL SCREENING BILATERAL MAMMOGRAM WITH TOMOSYNTHESIS AND CAD
TECHNIQUE: Bilateral screening digital craniocaudal and mediolateral oblique
mammograms were obtained. Bilateral screening digital breast
tomosynthesis was performed. The images were evaluated with
computer-aided detection.

[L MLO synth-2D]
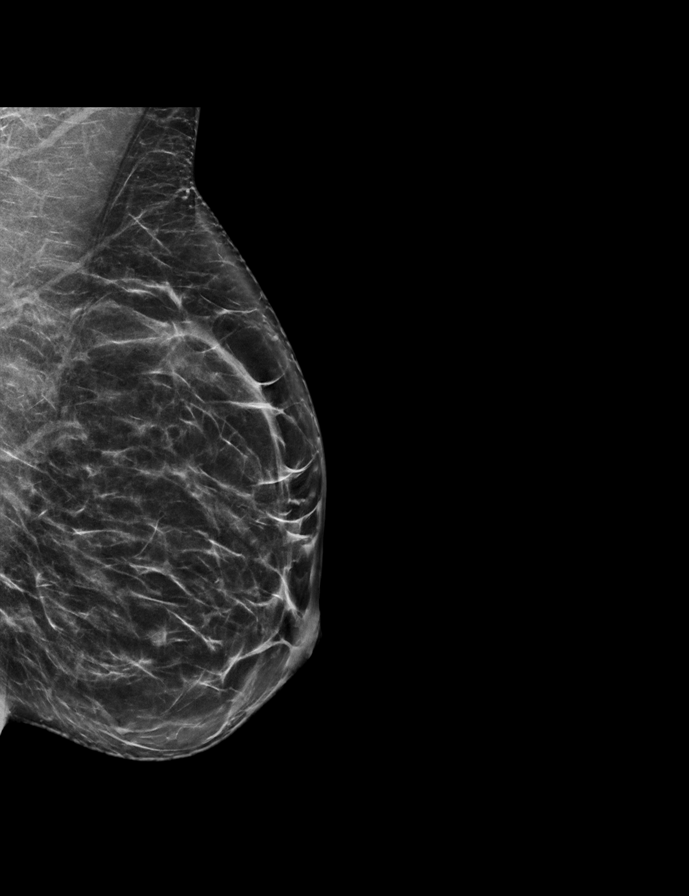

[R CC synth-2D]
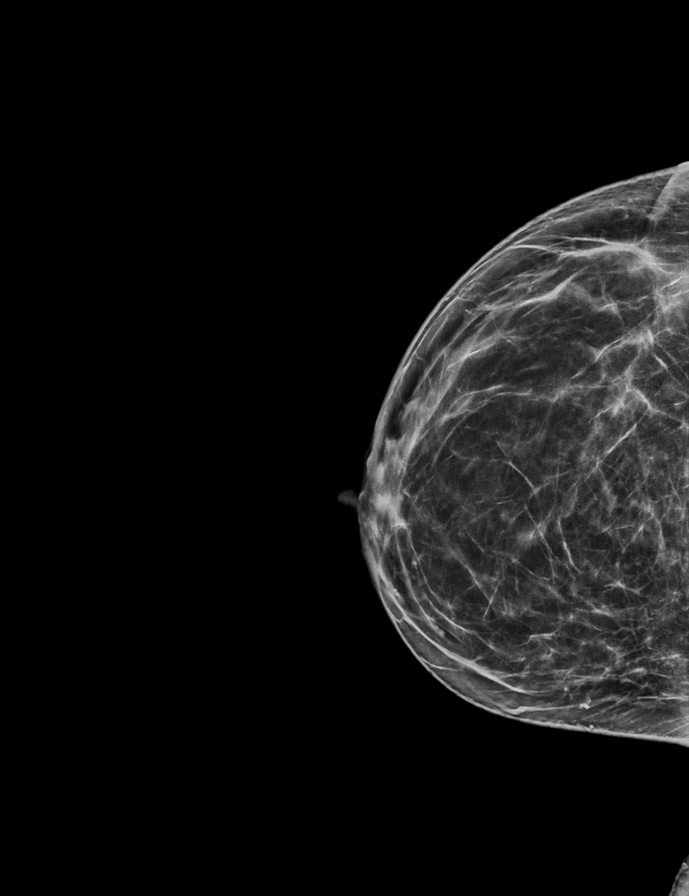

[L CC synth-2D]
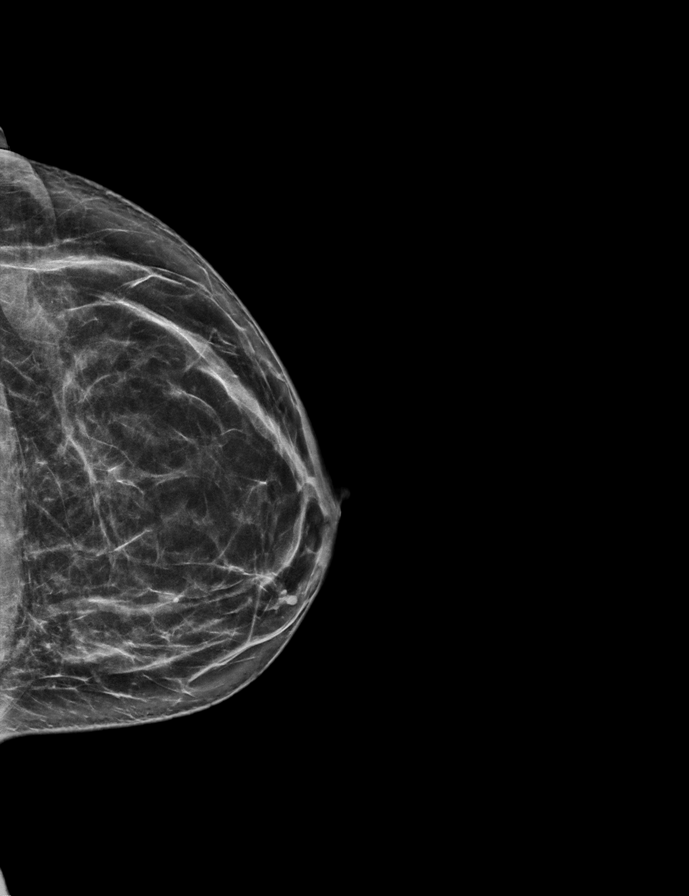

[R MLO synth-2D]
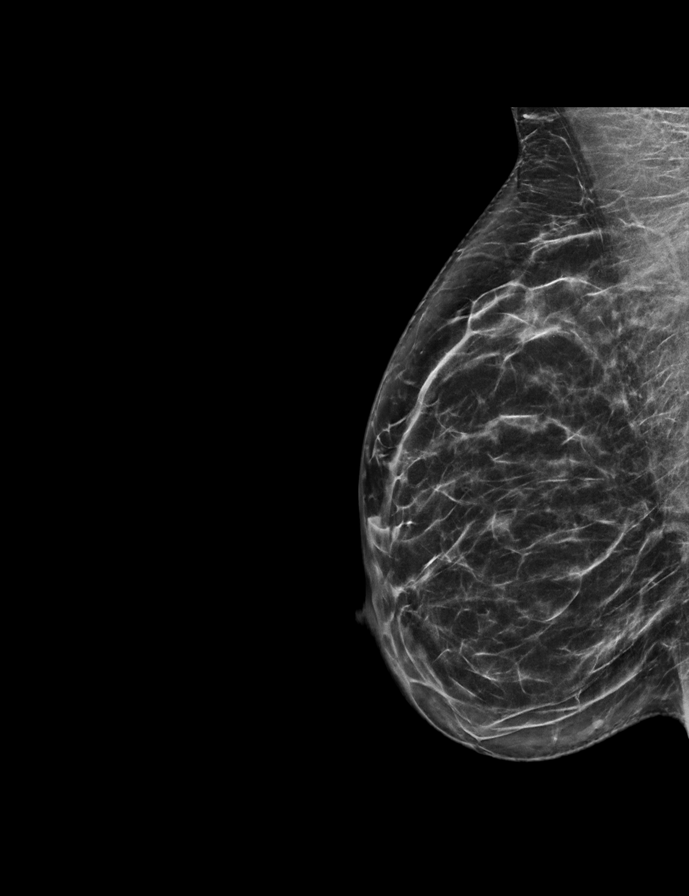

[R MLO tomo · 2 of 62 frames shown]
[frame 21/62]
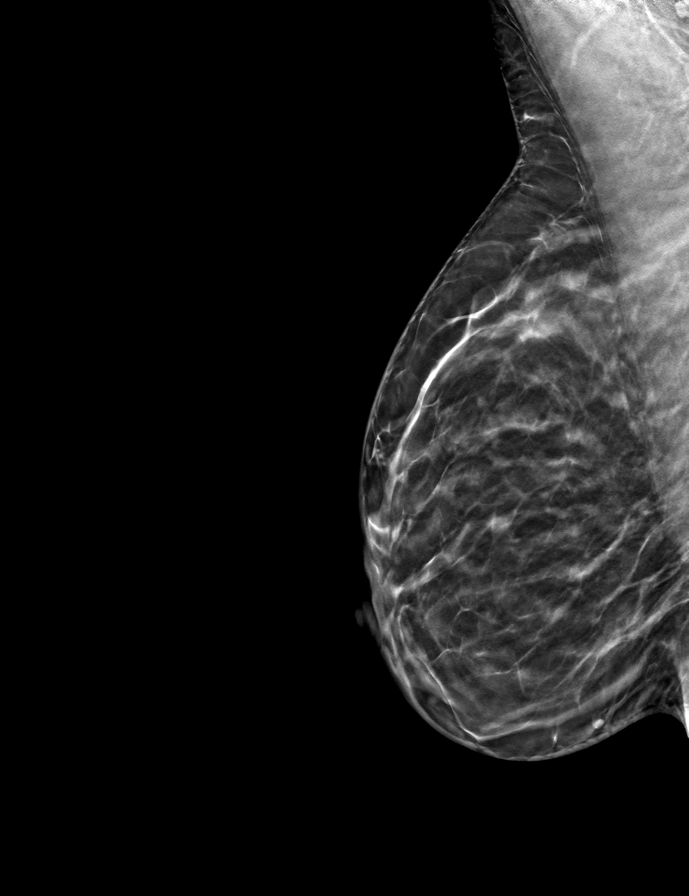
[frame 31/62]
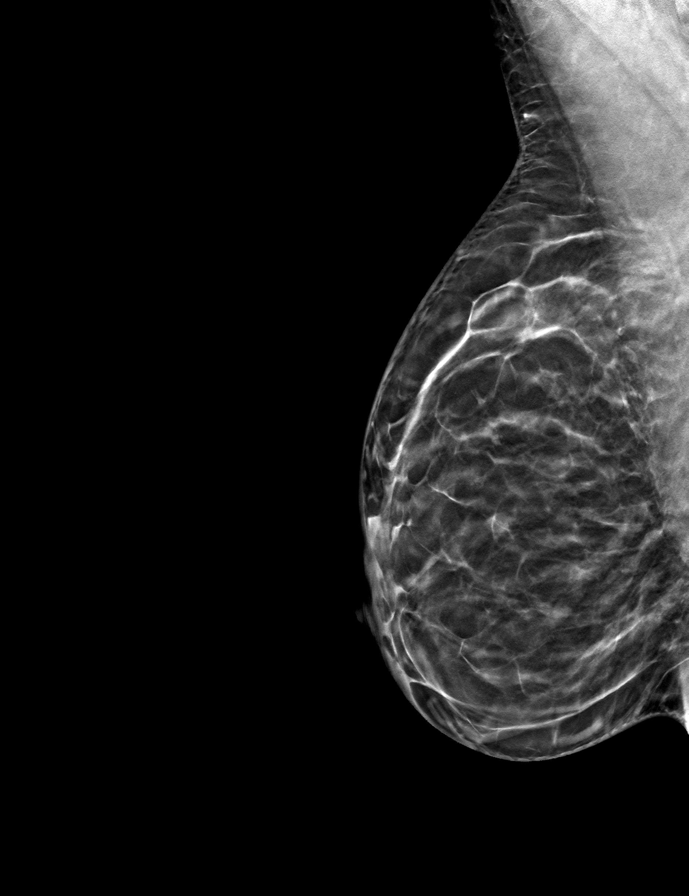

[R CC tomo · tomo slice 31/60.0]
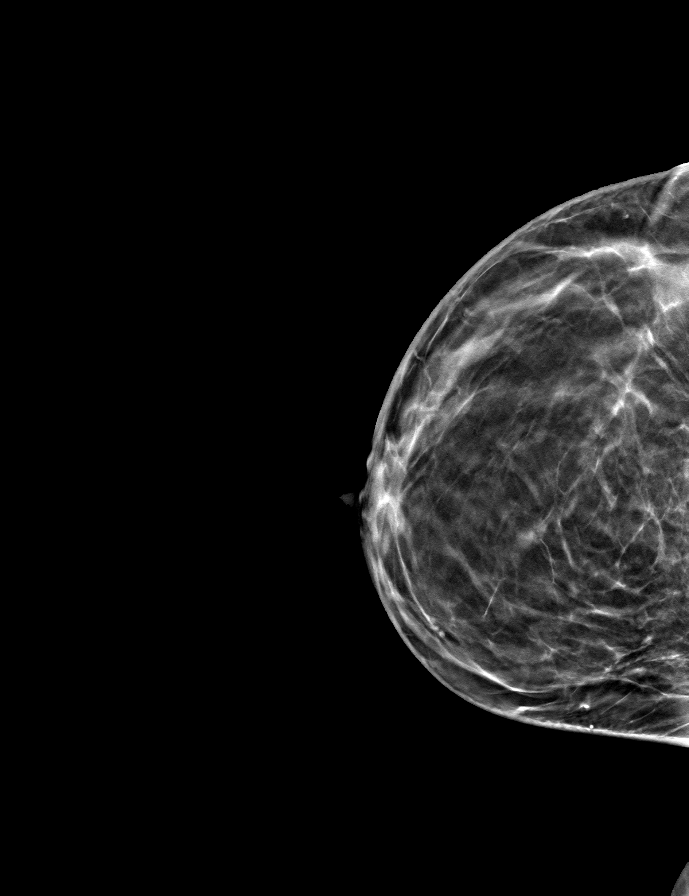

[L CC tomo · tomo slice 31/62.0]
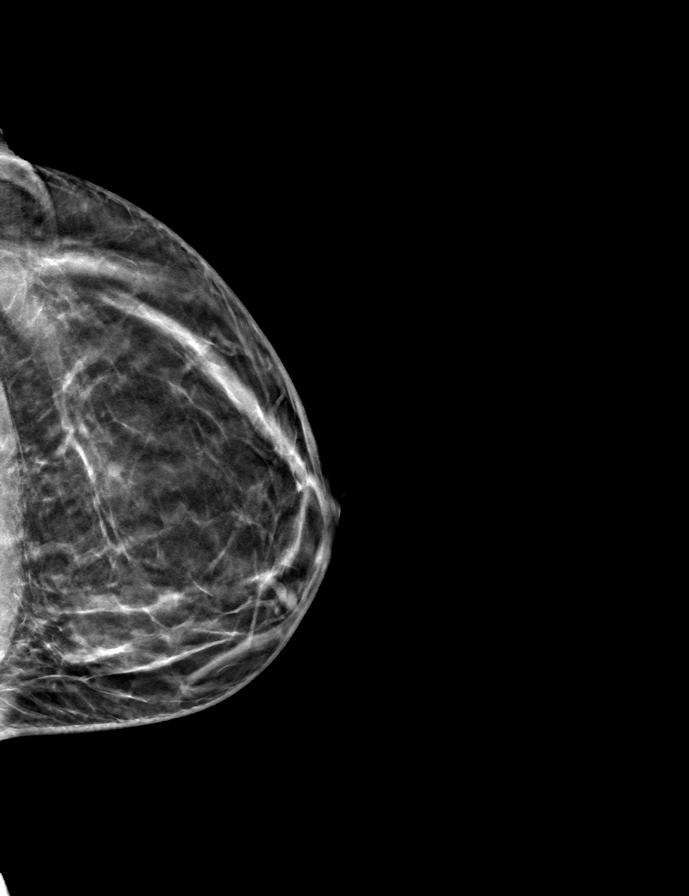

[L MLO tomo · tomo slice 34/67.0]
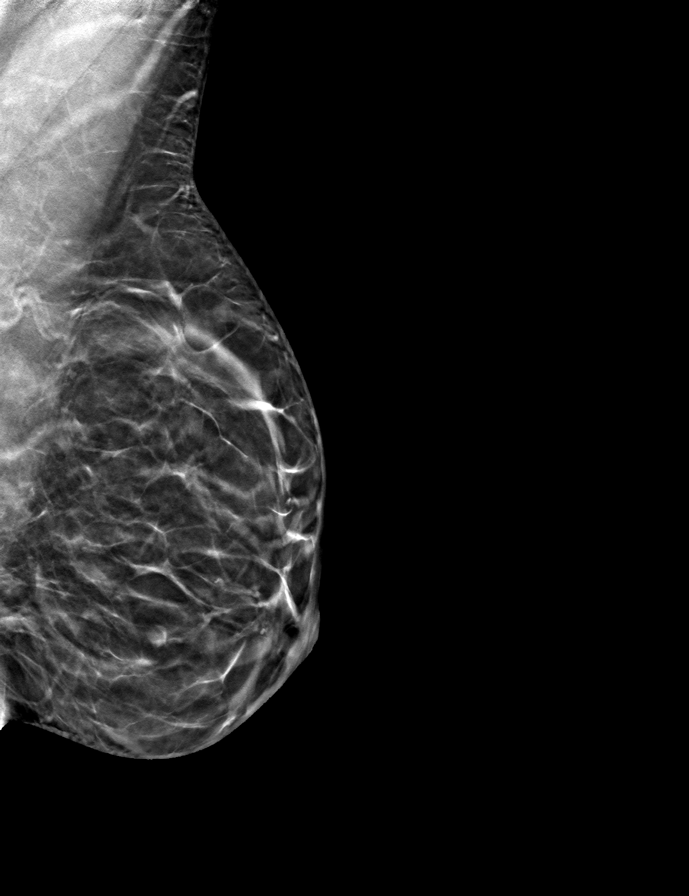

[9 of 24 positions shown; findings below may reference images not displayed]

ACR Breast Density Category b: There are scattered areas of
fibroglandular density.
FINDINGS: There are no findings suspicious for malignancy.
IMPRESSION: No mammographic evidence of malignancy. A result letter of this
screening mammogram will be mailed directly to the patient.

RECOMMENDATION:
Screening mammogram in one year. (Code:51-O-LD2)

BI-RADS CATEGORY  1: Negative.

## 2022-03-18 ENCOUNTER — Other Ambulatory Visit: Payer: Self-pay | Admitting: Obstetrics & Gynecology

## 2022-03-18 DIAGNOSIS — Z1231 Encounter for screening mammogram for malignant neoplasm of breast: Secondary | ICD-10-CM

## 2022-04-15 ENCOUNTER — Ambulatory Visit
Admission: RE | Admit: 2022-04-15 | Discharge: 2022-04-15 | Disposition: A | Discharge status: Home / Self Care | Payer: BC Managed Care – PPO | Source: Ambulatory Visit | Attending: Obstetrics & Gynecology | Admitting: Obstetrics & Gynecology

## 2022-04-15 DIAGNOSIS — Z1231 Encounter for screening mammogram for malignant neoplasm of breast: Secondary | ICD-10-CM

## 2022-05-31 DIAGNOSIS — D1801 Hemangioma of skin and subcutaneous tissue: Secondary | ICD-10-CM | POA: Diagnosis not present

## 2022-05-31 DIAGNOSIS — L814 Other melanin hyperpigmentation: Secondary | ICD-10-CM | POA: Diagnosis not present

## 2022-05-31 DIAGNOSIS — L821 Other seborrheic keratosis: Secondary | ICD-10-CM | POA: Diagnosis not present

## 2022-05-31 DIAGNOSIS — L578 Other skin changes due to chronic exposure to nonionizing radiation: Secondary | ICD-10-CM | POA: Diagnosis not present

## 2022-10-20 ENCOUNTER — Ambulatory Visit: Payer: BC Managed Care – PPO | Admitting: Obstetrics & Gynecology

## 2022-12-21 ENCOUNTER — Encounter: Payer: Self-pay | Admitting: Obstetrics & Gynecology

## 2022-12-21 ENCOUNTER — Ambulatory Visit (INDEPENDENT_AMBULATORY_CARE_PROVIDER_SITE_OTHER): Payer: BC Managed Care – PPO | Admitting: Obstetrics & Gynecology

## 2022-12-21 VITALS — BP 110/74 | HR 54 | Ht 63.0 in | Wt 129.0 lb

## 2022-12-21 DIAGNOSIS — Z789 Other specified health status: Secondary | ICD-10-CM

## 2022-12-21 DIAGNOSIS — Z01419 Encounter for gynecological examination (general) (routine) without abnormal findings: Secondary | ICD-10-CM | POA: Diagnosis not present

## 2022-12-21 NOTE — Progress Notes (Signed)
Mary Hood 02-11-1981 [id number]   History:    42 y.o. G3P3L3 Married.  2 sons 69 and 84 yo. Daughter 77 yo.   RP:  Established patient presenting for annual gyn exam    HPI: Menstrual periods every month with normal flow.  No breakthrough bleeding.  Satisfied with condoms for contraception. No pelvic pain and no pain with intercourse. Pap 09/2021 Neg.  Pap reflex today. Urine and bowel movements normal.  Breasts normal. Mammo Neg 04/2022.  Body mass index 22.85.  Very fit with regular running and weights.  Health labs with family physician.    Past medical history,surgical history, family history and social history were all reviewed and documented in the EPIC chart.  Gynecologic History Patient's last menstrual period was 12/13/2022 (exact date).  Obstetric History OB History  Gravida Para Term Preterm AB Living  3 3 3     3   SAB IAB Ectopic Multiple Live Births          3    # Outcome Date GA Lbr Len/2nd Weight Sex Delivery Anes PTL Lv  3 Term 09/21/12 [redacted]w[redacted]d 07:05 / 00:22 7 lb 4.2 oz (3.295 kg) M VBAC EPI  LIV  2 Term 2009 [redacted]w[redacted]d 16:00 6 lb 7 oz (2.92 kg) M VBAC EPI  LIV  1 Term 2007 [redacted]w[redacted]d  7 lb 14 oz (3.572 kg)  CS-LTranv EPI  LIV     Birth Comments: CPD     ROS: A ROS was performed and pertinent positives and negatives are included in the history. GENERAL: No fevers or chills. HEENT: No change in vision, no earache, sore throat or sinus congestion. NECK: No pain or stiffness. CARDIOVASCULAR: No chest pain or pressure. No palpitations. PULMONARY: No shortness of breath, cough or wheeze. GASTROINTESTINAL: No abdominal pain, nausea, vomiting or diarrhea, melena or bright red blood per rectum. GENITOURINARY: No urinary frequency, urgency, hesitancy or dysuria. MUSCULOSKELETAL: No joint or muscle pain, no back pain, no recent trauma. DERMATOLOGIC: No rash, no itching, no lesions. ENDOCRINE: No polyuria, polydipsia, no heat or cold intolerance. No recent change in weight.  HEMATOLOGICAL: No anemia or easy bruising or bleeding. NEUROLOGIC: No headache, seizures, numbness, tingling or weakness. PSYCHIATRIC: No depression, no loss of interest in normal activity or change in sleep pattern.     Exam:   BP 110/74   Pulse (!) 54   Ht 5\' 3"  (1.6 m)   Wt 129 lb (58.5 kg)   LMP 12/13/2022 (Exact Date) Comment: sexually active-condoms  SpO2 99%   BMI 22.85 kg/m   Body mass index is 22.85 kg/m.  General appearance : Well developed well nourished female. No acute distress HEENT: Eyes: no retinal hemorrhage or exudates,  Neck supple, trachea midline, no carotid bruits, no thyroidmegaly Lungs: Clear to auscultation, no rhonchi or wheezes, or rib retractions  Heart: Regular rate and rhythm, no murmurs or gallops Breast:Examined in sitting and supine position were symmetrical in appearance, no palpable masses or tenderness,  no skin retraction, no nipple inversion, no nipple discharge, no skin discoloration, no axillary or supraclavicular lymphadenopathy Abdomen: no palpable masses or tenderness, no rebound or guarding Extremities: no edema or skin discoloration or tenderness  Pelvic: Vulva: Normal             Vagina: No gross lesions or discharge  Cervix: No gross lesions or discharge  Uterus  AV, normal size, shape and consistency, non-tender and mobile  Adnexa  Without masses or tenderness  Anus: Normal   Assessment/Plan:  42 y.o. female for annual exam   1. Well female exam with routine gynecological exam Menstrual periods every month with normal flow.  No breakthrough bleeding.  Satisfied with condoms for contraception. No pelvic pain and no pain with intercourse. Pap 09/2021 Neg.  Pap reflex today. Urine and bowel movements normal.  Breasts normal. Mammo Neg 04/2022.  Body mass index 22.85.  Very fit with regular running and weights.  Health labs with family physician.   2. Use of condoms for contraception   [name] MD, 8:20 AM

## 2023-03-06 ENCOUNTER — Other Ambulatory Visit: Payer: Self-pay | Admitting: Obstetrics & Gynecology

## 2023-03-06 DIAGNOSIS — Z1231 Encounter for screening mammogram for malignant neoplasm of breast: Secondary | ICD-10-CM

## 2023-04-05 ENCOUNTER — Other Ambulatory Visit: Payer: Self-pay | Admitting: Nurse Practitioner

## 2023-04-05 DIAGNOSIS — Z1231 Encounter for screening mammogram for malignant neoplasm of breast: Secondary | ICD-10-CM

## 2023-04-06 ENCOUNTER — Other Ambulatory Visit: Payer: Self-pay | Admitting: Obstetrics and Gynecology

## 2023-04-06 DIAGNOSIS — Z1231 Encounter for screening mammogram for malignant neoplasm of breast: Secondary | ICD-10-CM

## 2023-04-18 ENCOUNTER — Ambulatory Visit
Admission: RE | Admit: 2023-04-18 | Discharge: 2023-04-18 | Disposition: A | Discharge status: Home / Self Care | Payer: BC Managed Care – PPO | Source: Ambulatory Visit | Attending: Obstetrics & Gynecology | Admitting: Obstetrics & Gynecology

## 2023-04-18 DIAGNOSIS — Z1231 Encounter for screening mammogram for malignant neoplasm of breast: Secondary | ICD-10-CM | POA: Diagnosis not present

## 2023-12-20 DIAGNOSIS — R5383 Other fatigue: Secondary | ICD-10-CM | POA: Diagnosis not present

## 2023-12-20 DIAGNOSIS — Z6822 Body mass index (BMI) 22.0-22.9, adult: Secondary | ICD-10-CM | POA: Diagnosis not present

## 2023-12-20 DIAGNOSIS — Z01419 Encounter for gynecological examination (general) (routine) without abnormal findings: Secondary | ICD-10-CM | POA: Diagnosis not present

## 2023-12-20 DIAGNOSIS — Z1151 Encounter for screening for human papillomavirus (HPV): Secondary | ICD-10-CM | POA: Diagnosis not present

## 2023-12-20 DIAGNOSIS — Z124 Encounter for screening for malignant neoplasm of cervix: Secondary | ICD-10-CM | POA: Diagnosis not present

## 2024-01-16 DIAGNOSIS — Z1211 Encounter for screening for malignant neoplasm of colon: Secondary | ICD-10-CM | POA: Diagnosis not present

## 2024-02-07 DIAGNOSIS — K635 Polyp of colon: Secondary | ICD-10-CM | POA: Diagnosis not present

## 2024-02-07 DIAGNOSIS — K5730 Diverticulosis of large intestine without perforation or abscess without bleeding: Secondary | ICD-10-CM | POA: Diagnosis not present

## 2024-02-07 DIAGNOSIS — Z1211 Encounter for screening for malignant neoplasm of colon: Secondary | ICD-10-CM | POA: Diagnosis not present

## 2024-04-02 ENCOUNTER — Other Ambulatory Visit: Payer: Self-pay | Admitting: Obstetrics and Gynecology

## 2024-04-02 DIAGNOSIS — Z1231 Encounter for screening mammogram for malignant neoplasm of breast: Secondary | ICD-10-CM

## 2024-04-30 ENCOUNTER — Ambulatory Visit
Admission: RE | Admit: 2024-04-30 | Discharge: 2024-04-30 | Disposition: A | Discharge status: Home / Self Care | Source: Ambulatory Visit | Attending: Obstetrics and Gynecology

## 2024-04-30 DIAGNOSIS — Z1231 Encounter for screening mammogram for malignant neoplasm of breast: Secondary | ICD-10-CM
